# Patient Record
Sex: Female | Born: 1965 | Race: White | Hispanic: No | Marital: Married | Smoking: Current every day smoker
Health system: Southern US, Community
[De-identification: ages and names within clinical notes are randomized; demographics above are authoritative.]

## PROBLEM LIST (undated history)

## (undated) DIAGNOSIS — G459 Transient cerebral ischemic attack, unspecified: Secondary | ICD-10-CM

## (undated) DIAGNOSIS — E119 Type 2 diabetes mellitus without complications: Secondary | ICD-10-CM

## (undated) DIAGNOSIS — I639 Cerebral infarction, unspecified: Secondary | ICD-10-CM

## (undated) HISTORY — DX: Type 2 diabetes mellitus without complications: E11.9

## (undated) HISTORY — PX: CHOLECYSTECTOMY: SHX55

## (undated) HISTORY — DX: Transient cerebral ischemic attack, unspecified: G45.9

## (undated) HISTORY — PX: ABDOMINAL HYSTERECTOMY: SHX81

## (undated) HISTORY — PX: BLADDER SURGERY: SHX569

## (undated) HISTORY — DX: Cerebral infarction, unspecified: I63.9

---

## 2011-01-30 DIAGNOSIS — M25579 Pain in unspecified ankle and joints of unspecified foot: Secondary | ICD-10-CM

## 2011-02-02 ENCOUNTER — Inpatient Hospital Stay (INDEPENDENT_AMBULATORY_CARE_PROVIDER_SITE_OTHER)
Admission: RE | Admit: 2011-02-02 | Discharge: 2011-02-02 | Disposition: A | Discharge status: Home / Self Care | Payer: Self-pay | Source: Ambulatory Visit | Attending: Emergency Medicine | Admitting: Emergency Medicine

## 2011-02-02 ENCOUNTER — Ambulatory Visit
Admission: RE | Admit: 2011-02-02 | Discharge: 2011-02-02 | Disposition: A | Discharge status: Home / Self Care | Payer: Self-pay | Source: Ambulatory Visit | Attending: Emergency Medicine | Admitting: Emergency Medicine

## 2011-02-02 ENCOUNTER — Telehealth (INDEPENDENT_AMBULATORY_CARE_PROVIDER_SITE_OTHER): Payer: Self-pay | Admitting: *Deleted

## 2011-02-02 ENCOUNTER — Ambulatory Visit (HOSPITAL_COMMUNITY): Admission: RE | Admit: 2011-02-02 | Payer: Self-pay | Source: Ambulatory Visit

## 2011-02-02 ENCOUNTER — Other Ambulatory Visit: Payer: Self-pay | Admitting: Emergency Medicine

## 2011-02-02 ENCOUNTER — Encounter: Payer: Self-pay | Admitting: Emergency Medicine

## 2011-02-02 DIAGNOSIS — M25579 Pain in unspecified ankle and joints of unspecified foot: Secondary | ICD-10-CM

## 2011-02-08 ENCOUNTER — Telehealth (INDEPENDENT_AMBULATORY_CARE_PROVIDER_SITE_OTHER): Payer: Self-pay | Admitting: Emergency Medicine

## 2011-09-28 NOTE — Progress Notes (Signed)
Summary: INJURY TO LEFT FOOT   Vital Signs:  Patient Profile:   45 Years Old Female CC:      left foot/ankle pain x 2 days Height:     67 inches Weight:      259 pounds O2 Sat:      98 % O2 treatment:    Room Air Temp:     97.8 degrees F oral Pulse rate:   101 / minute Resp:     16 per minute BP sitting:   151 / 87  (left arm) Cuff size:   large  Pt. in pain?   yes    Location:   left foot  Vitals Entered By: Lajean Saver RN (February 02, 2011 12:14 PM)                   Updated Prior Medication List: No Medications Current Allergies: No known allergies History of Present Illness History from: patient Chief Complaint: left foot/ankle pain x 2 days History of Present Illness: Left foot injury for 2 days.  She was walking out of her apartment yesterday, felt that she twisted her ankle or stepped funny.  Had immediate pain and has had some swelling.  She hurt her R ankle many years ago and it never seemed to get completely better.  Used ice, Ace wrap, and Aleve, all helped a little bit.    REVIEW OF SYSTEMS Constitutional Symptoms      Denies fever, chills, night sweats, weight loss, weight gain, and fatigue.  Eyes       Denies change in vision, eye pain, eye discharge, glasses, contact lenses, and eye surgery. Ear/Nose/Throat/Mouth       Denies hearing loss/aids, change in hearing, ear pain, ear discharge, dizziness, frequent runny nose, frequent nose bleeds, sinus problems, sore throat, hoarseness, and tooth pain or bleeding.  Respiratory       Denies dry cough, productive cough, wheezing, shortness of breath, asthma, bronchitis, and emphysema/COPD.  Cardiovascular       Denies murmurs, chest pain, and tires easily with exhertion.    Gastrointestinal       Denies stomach pain, nausea/vomiting, diarrhea, constipation, blood in bowel movements, and indigestion. Genitourniary       Denies painful urination, kidney stones, and loss of urinary control. Neurological  Denies paralysis, seizures, and fainting/blackouts. Musculoskeletal       Complains of muscle pain, joint pain, and swelling.      Denies joint stiffness, decreased range of motion, redness, muscle weakness, and gout.      Comments: left foot/ankle Skin       Denies bruising, unusual mles/lumps or sores, and hair/skin or nail changes.  Psych       Denies mood changes, temper/anger issues, anxiety/stress, speech problems, depression, and sleep problems. Other Comments: Patient was stepping out of outside building and hurt left foot/anklle. Swelling and bruisin g noted . She ahs used ice and heat and wrapped her ankle. Previous left ankle injuries   Past History:  Past Medical History: Unremarkable  Past Surgical History: Cholecystectomy Hysterectomy  Family History: Mother- MI Aunts- Breast CA Grandmother- CVA  Social History: Current Smoker 1PPD Alcohol use-yes- Rare Drug use-no Smoking Status:  current Drug Use:  no Physical Exam General appearance: well developed, well nourished, no acute distress MSE: oriented to time, place, and person L foot/ ankle: FROM, full strength.  Most tenderness is ATFL, anterior ankle, along peroneal tendon.  No true TTP posterior aspects of medial/lateral malleolus,  navicular, base of 5th, calcaneus, Achilles, or proximal fibula.  +swelling but similar to opposite side. Distal NV status intact. Assessment New Problems: ANKLE PAIN (ICD-719.47)   Plan New Orders: T-DG Ankle Complete*L* [73610] No Charge Patient Arrived (NCPA0) [NCPA0] Planning Comments:   Xray is ordered.  Radiology performed the Xray but lost it and we cannot see it.  According to the [facility] ankle rules, patient most likely with sprained ankle and slight peroneal tendonopathy.    Xray has now returned and has been read.  Moderate osteoarthritic changes and spurring is present, but no acute fracture is seen.  Patient to be called and informed.  Should ice, elevate, rest,  with Ibuprofen/Aleve for a few days.  If not improving, would advise that she see Dr. Pearletha Forge for evaluation.   The patient and/or caregiver has been counseled thoroughly with regard to medications prescribed including dosage, schedule, interactions, rationale for use, and possible side effects and they verbalize understanding.  Diagnoses and expected course of recovery discussed and will return if not improved as expected or if the condition worsens. Patient and/or caregiver verbalized understanding.   Orders Added: 1)  T-DG Ankle Complete*L* [73610] 2)  No Charge Patient Arrived (NCPA0) [NCPA0]

## 2011-09-28 NOTE — Telephone Encounter (Signed)
  Phone Note Outgoing Call   Call placed by: Clemens Catholic LPN,  February 02, 2011 4:43 PM Call placed to: Patient Summary of Call: pt informed of her xray results. advised her to call back if not improving and we will sch appt with dr Pearletha Forge. Initial call taken by: Clemens Catholic LPN,  February 02, 2011 4:44 PM

## 2011-09-28 NOTE — Telephone Encounter (Signed)
Summary: z  Phone Note Outgoing Call Call back at [facility] Phone [phone]   Call placed by: Emilio Math,  February 08, 2011 2:43 PM Call placed to: Patient Summary of Call: Lft msg hope her ankle is better, make appt with Dr Pearletha Forge

## 2012-03-30 ENCOUNTER — Encounter: Payer: Self-pay | Admitting: Obstetrics and Gynecology

## 2012-04-12 ENCOUNTER — Encounter: Payer: Self-pay | Admitting: Obstetrics and Gynecology

## 2013-02-14 ENCOUNTER — Emergency Department
Admission: EM | Admit: 2013-02-14 | Discharge: 2013-02-14 | Disposition: A | Discharge status: Home / Self Care | Payer: Self-pay | Source: Home / Self Care | Attending: Family Medicine | Admitting: Family Medicine

## 2013-02-14 ENCOUNTER — Encounter: Payer: Self-pay | Admitting: *Deleted

## 2013-02-14 DIAGNOSIS — K0889 Other specified disorders of teeth and supporting structures: Secondary | ICD-10-CM

## 2013-02-14 DIAGNOSIS — J0100 Acute maxillary sinusitis, unspecified: Secondary | ICD-10-CM

## 2013-02-14 DIAGNOSIS — K089 Disorder of teeth and supporting structures, unspecified: Secondary | ICD-10-CM

## 2013-02-14 MED ORDER — BENZONATATE 200 MG PO CAPS: 200.0000 mg | ORAL_CAPSULE | Freq: Every day | ORAL | Status: DC

## 2013-02-14 MED ORDER — HYDROCODONE-ACETAMINOPHEN 5-325 MG PO TABS: ORAL_TABLET | ORAL | Status: DC

## 2013-02-14 MED ORDER — AMOXICILLIN 875 MG PO TABS: 875.0000 mg | ORAL_TABLET | Freq: Two times a day (BID) | ORAL | Status: DC

## 2013-02-14 NOTE — ED Provider Notes (Signed)
History     CSN: [id number]  Arrival date & time 02/14/13  4098   First MD Initiated Contact with Patient 02/14/13 1935      Chief Complaint  Patient presents with  . Dental Pain  . Facial Pain       HPI Comments: Patient states that she has a cracked right maxillary tooth for about 4 months; the tooth has become increasingly painful over the past week.  She has also had a sore throat for about 2 to 3 days, with increasing sinus congestion.  She has now developed right facial pain.  No fevers, chills, and sweats but she has felt hot.  The history is provided by the patient.    History reviewed. No pertinent past medical history.  Past Surgical History  Procedure Laterality Date  . Abdominal hysterectomy    . Bladder surgery      Family History  Problem Relation Age of Onset  . Stroke Mother     History  Substance Use Topics  . Smoking status: Current Every Day Smoker -- 1.00 packs/day  . Smokeless tobacco: Not on file  . Alcohol Use: Yes    OB History   Grav Para Term Preterm Abortions TAB SAB Ect Mult Living                  Review of Systems + sore throat + cough No pleuritic pain No wheezing + nasal congestion + post-nasal drainage + sinus pain/pressure + toothache No itchy/red eyes ? earache No hemoptysis No SOB No fever/chills No nausea No vomiting No abdominal pain No diarrhea No urinary symptoms No skin rashes + fatigue No myalgias + headache Used OTC meds without relief  Allergies  Review of patient's allergies indicates no known allergies.  Home Medications   Current Outpatient Rx  Name  Route  Sig  Dispense  Refill  . naproxen sodium (ANAPROX) 220 MG tablet   Oral   Take 220 mg by mouth 2 (two) times daily with a meal.         . amoxicillin (AMOXIL) 875 MG tablet   Oral   Take 1 tablet (875 mg total) by mouth 2 (two) times daily.   28 tablet   0   . benzonatate (TESSALON) 200 MG capsule   Oral   Take 1 capsule (200  mg total) by mouth at bedtime.   12 capsule   0   . HYDROcodone-acetaminophen (NORCO/VICODIN) 5-325 MG per tablet      Take one by mouth at bedtime as needed for pain   10 tablet   0     BP 138/86  Pulse 99  Temp(Src) 98.5 F (36.9 C)  Resp 18  Ht 5\' 7"  (1.702 m)  Wt 279 lb (126.554 kg)  BMI 43.69 kg/m2  SpO2 97%  Physical Exam  Nursing note and vitals reviewed. Constitutional: She is oriented to person, place, and time. She appears well-developed and well-nourished. No distress.  Patient is obese (BMI 43.7)  HENT:  Head: Normocephalic.  Right Ear: Tympanic membrane and ear canal normal.  Left Ear: Tympanic membrane and ear canal normal.  Nose: Nose normal.  Mouth/Throat: No oral lesions. Dental caries present. No oropharyngeal exudate.    There is tenderness over the right maxillary sinus area.  There is tenderness over maxillary and mandibular teeth noted in red.  Eyes: Conjunctivae and EOM are normal. Pupils are equal, round, and reactive to light. Right eye exhibits no discharge. Left eye exhibits  no discharge.  Neck: Neck supple.  Cardiovascular: Normal rate and normal heart sounds.   Pulmonary/Chest: Breath sounds normal.  Abdominal: Soft. There is no tenderness.  Lymphadenopathy:    She has no cervical adenopathy.  Neurological: She is alert and oriented to person, place, and time.  Skin: Skin is warm and dry.    ED Course  Procedures  None      1. Pain, dental   2. Acute maxillary sinusitis; suspect a developing viral URI      MDM  Begin amoxicillin for two weeks.  Prescription written for Benzonatate [facility]) to take at bedtime for night-time cough.  Lortab for pain at night. Take Mucinex D (guaifenesin with decongestant) twice daily for congestion.  Increase fluid intake, rest. May use Afrin nasal spray (or generic oxymetazoline) twice daily for about 5 days.  Also recommend using saline nasal spray several times daily and saline nasal  irrigation (AYR is a common brand) Stop all antihistamines for now, and other non-prescription cough/cold preparations. May take Ibuprofen 200mg , 4 tabs every 8 hours with food as needed. Followup with dentist as soon as possible. If symptoms become significantly worse during the night or over the weekend, proceed to the local emergency room.         Lattie Haw, MD 02/18/13 [phone]

## 2013-02-14 NOTE — ED Notes (Signed)
Pt reports having a cracked tooth on the RT side x 4 mths. She also c/o RT side facial pain radiating to her neck and nasal congestion x 1wk. Denies fever.

## 2013-02-18 ENCOUNTER — Telehealth: Payer: Self-pay | Admitting: Emergency Medicine

## 2013-10-10 ENCOUNTER — Other Ambulatory Visit: Payer: Self-pay | Admitting: Internal Medicine

## 2013-10-10 DIAGNOSIS — R130 Aphagia: Secondary | ICD-10-CM

## 2013-10-28 ENCOUNTER — Emergency Department (INDEPENDENT_AMBULATORY_CARE_PROVIDER_SITE_OTHER)
Admission: EM | Admit: 2013-10-28 | Discharge: 2013-10-28 | Disposition: A | Discharge status: Home / Self Care | Payer: Self-pay | Source: Home / Self Care | Attending: Family Medicine | Admitting: Family Medicine

## 2013-10-28 ENCOUNTER — Encounter: Payer: Self-pay | Admitting: Emergency Medicine

## 2013-10-28 DIAGNOSIS — K089 Disorder of teeth and supporting structures, unspecified: Secondary | ICD-10-CM

## 2013-10-28 DIAGNOSIS — K219 Gastro-esophageal reflux disease without esophagitis: Secondary | ICD-10-CM

## 2013-10-28 DIAGNOSIS — IMO0002 Reserved for concepts with insufficient information to code with codable children: Secondary | ICD-10-CM

## 2013-10-28 DIAGNOSIS — E1165 Type 2 diabetes mellitus with hyperglycemia: Secondary | ICD-10-CM

## 2013-10-28 DIAGNOSIS — K0889 Other specified disorders of teeth and supporting structures: Secondary | ICD-10-CM

## 2013-10-28 DIAGNOSIS — IMO0001 Reserved for inherently not codable concepts without codable children: Secondary | ICD-10-CM

## 2013-10-28 DIAGNOSIS — H539 Unspecified visual disturbance: Secondary | ICD-10-CM

## 2013-10-28 LAB — POCT FASTING CBG KUC MANUAL ENTRY: POCT Glucose (KUC): 247 mg/dL — AB (ref 70–99)

## 2013-10-28 MED ORDER — TRAMADOL HCL 50 MG PO TABS: ORAL_TABLET | ORAL | Status: DC

## 2013-10-28 MED ORDER — AMOXICILLIN 875 MG PO TABS: 875.0000 mg | ORAL_TABLET | Freq: Two times a day (BID) | ORAL | Status: DC

## 2013-10-28 MED ORDER — RANITIDINE HCL 150 MG PO TABS: 150.0000 mg | ORAL_TABLET | Freq: Two times a day (BID) | ORAL | Status: DC

## 2013-10-28 NOTE — ED Provider Notes (Signed)
CSN: [id number]     Arrival date & time 10/28/13  1522 History   First MD Initiated Contact with Patient 10/28/13 1558     Chief Complaint  Patient presents with  . Loss of Vision    for 2 weeks  . Facial Pain    for 1 week      HPI Comments: Patient presents with several complaints: 1)  She has had bilateral decreased vision (near and far) for about one month. 2)  She has had increased thirst.  She checked her glucose at work recently where it was 289.  She has had bilateral tingling in her feet for several weeks. 3)  She complains of recurring stomach discomfort, relieved with PeptoBismol. 4)  She complains of recurrent dental pain in two upper and lower teeth that were evaluated and treated here 02/18/13.  She never followed up with a dentist.  No fevers, chills, and sweats   The history is provided by the patient.    History reviewed. No pertinent past medical history. Past Surgical History  Procedure Laterality Date  . Abdominal hysterectomy    . Bladder surgery     Family History  Problem Relation Age of Onset  . Heart attack Mother   . Cancer Other   . Stroke Other    History  Substance Use Topics  . Smoking status: Current Every Day Smoker -- 1.00 packs/day  . Smokeless tobacco: Not on file  . Alcohol Use: Yes   OB History   Grav Para Term Preterm Abortions TAB SAB Ect Mult Living                 Review of Systems  Constitutional: Negative for chills and fatigue.  HENT: Positive for congestion, dental problem, facial swelling and sinus pressure. Negative for ear discharge, ear pain, mouth sores, nosebleeds, sore throat and trouble swallowing.        Decreased vision  Eyes: Negative.   Cardiovascular: Negative.   Gastrointestinal: Positive for abdominal pain and diarrhea. Negative for nausea and vomiting.  Endocrine: Positive for polydipsia. Negative for polyuria.  Genitourinary: Negative for dysuria, urgency, frequency and hematuria.  Musculoskeletal: Positive  for arthralgias.  Skin: Negative.   Neurological: Negative.     Allergies  Review of patient's allergies indicates no known allergies.  Home Medications   Current Outpatient Rx  Name  Route  Sig  Dispense  Refill  . amoxicillin (AMOXIL) 875 MG tablet   Oral   Take 1 tablet (875 mg total) by mouth 2 (two) times daily.   28 tablet   0   . naproxen sodium (ANAPROX) 220 MG tablet   Oral   Take 220 mg by mouth 2 (two) times daily with a meal.         . ranitidine (ZANTAC) 150 MG tablet   Oral   Take 1 tablet (150 mg total) by mouth 2 (two) times daily.   60 tablet   1   . traMADol (ULTRAM) 50 MG tablet      One PO Q6hr prn pain   16 tablet   0    BP 128/86  Pulse 101  Temp(Src) 98.6 F (37 C) (Oral)  Ht 5\' 7"  (1.702 m)  Wt 261 lb (118.389 kg)  BMI 40.87 kg/m2  SpO2 96% Physical Exam  Nursing note and vitals reviewed. Constitutional: She is oriented to person, place, and time. She appears well-developed and well-nourished. No distress.  Patient is obese (BMI 40.9)  HENT:  Head: Normocephalic.  Right Ear: External ear normal.  Left Ear: External ear normal.  Nose: Nose normal.  Mouth/Throat: Oropharynx is clear and moist. No oral lesions. No trismus in the jaw. Abnormal dentition. Dental caries present. No dental abscesses or uvula swelling.    Noted teeth are eroded and tender to tap.  Surrounding gingiva tender to palpation but not swollen.  Right facial tenderness over mandible but not swollen or erythematous.  Eyes: Conjunctivae, EOM and lids are normal. Pupils are equal, round, and reactive to light. Right eye exhibits no discharge. Left eye exhibits no discharge.  Fundoscopic exam:      The right eye shows no hemorrhage.       The left eye shows no hemorrhage.  Neck: Neck supple.  Cardiovascular: Normal heart sounds.   Pulmonary/Chest: Breath sounds normal.  Abdominal: Soft. There is no tenderness.  Musculoskeletal: She exhibits no edema.    Lymphadenopathy:    She has no cervical adenopathy.  Neurological: She is alert and oriented to person, place, and time.  Skin: Skin is warm and dry. No rash noted.    ED Course  Procedures  none  Labs Reviewed -  FS Glucose 247       MDM   1. Pain, dental   2. Vision changes   3. GERD (gastroesophageal reflux disease)   4. Diabetes type 2, uncontrolled   5. Severe obesity (BMI >= 40)    Begin amoxicillin (free at [employer]).  Tramadol for pain.  Followup with dentist As soon as possible.  Begin Zantac  Followup with PCP for DM management.  Followup with ophthalmologist As soon as possible.     Kandra Nicolas, MD 10/29/13 1025

## 2013-10-28 NOTE — ED Notes (Signed)
Denise Tyler complains of changes in her vision for the last 2 weeks. She checked her sugar at work and it was over 200 mg/dl.  She also reports facial pain for the last week. She did have chills and sweats for two days.

## 2013-10-28 NOTE — Discharge Instructions (Signed)
Recommend follow-up with ophthalmologist as soon as possible   Blurred Vision You have been seen today complaining of blurred vision. This means you have a loss of ability to see small details.  CAUSES  Blurred vision can be a symptom of underlying eye problems, such as:  Aging of the eye (presbyopia).  Glaucoma.  Cataracts.  Eye infection.  Eye-related migraine.  Diabetes mellitus.  Fatigue.  Migraine headaches.  High blood pressure.  Breakdown of the back of the eye (macular degeneration).  Problems caused by some medications. The most common cause of blurred vision is the need for eyeglasses or a new prescription. Today in the emergency department, no cause for your blurred vision can be found. SYMPTOMS  Blurred vision is the loss of visual sharpness and detail (acuity). DIAGNOSIS  Should blurred vision continue, you should see your caregiver. If your caregiver is your primary care physician, he or she may choose to refer you to another specialist.  TREATMENT  Do not ignore your blurred vision. Make sure to have it checked out to see if further treatment or referral is necessary. SEEK MEDICAL CARE IF:  You are unable to get into a specialist so we can help you with a referral. SEEK IMMEDIATE MEDICAL CARE IF: You have severe eye pain, severe headache, or sudden loss of vision. MAKE SURE YOU:   Understand these instructions.  Will watch your condition.  Will get help right away if you are not doing well or get worse. Document Released: 10/15/2003 Document Revised: 01/04/2012 Document Reviewed: 05/16/2008 [facility]: [facility] Rhonda W Patient Information 2014 [place].

## 2013-11-08 ENCOUNTER — Encounter: Payer: Self-pay | Admitting: Physician Assistant

## 2013-11-08 ENCOUNTER — Ambulatory Visit (INDEPENDENT_AMBULATORY_CARE_PROVIDER_SITE_OTHER): Payer: Self-pay | Admitting: Physician Assistant

## 2013-11-08 VITALS — BP 141/74 | HR 93 | Ht 67.0 in | Wt 264.0 lb

## 2013-11-08 DIAGNOSIS — Z79899 Other long term (current) drug therapy: Secondary | ICD-10-CM

## 2013-11-08 DIAGNOSIS — K219 Gastro-esophageal reflux disease without esophagitis: Secondary | ICD-10-CM

## 2013-11-08 DIAGNOSIS — IMO0002 Reserved for concepts with insufficient information to code with codable children: Secondary | ICD-10-CM | POA: Insufficient documentation

## 2013-11-08 DIAGNOSIS — E1165 Type 2 diabetes mellitus with hyperglycemia: Secondary | ICD-10-CM | POA: Insufficient documentation

## 2013-11-08 DIAGNOSIS — R1013 Epigastric pain: Secondary | ICD-10-CM | POA: Insufficient documentation

## 2013-11-08 DIAGNOSIS — E119 Type 2 diabetes mellitus without complications: Secondary | ICD-10-CM

## 2013-11-08 DIAGNOSIS — R7309 Other abnormal glucose: Secondary | ICD-10-CM

## 2013-11-08 LAB — POCT GLYCOSYLATED HEMOGLOBIN (HGB A1C): HEMOGLOBIN A1C: 8.9

## 2013-11-08 MED ORDER — LISINOPRIL 10 MG PO TABS: 10.0000 mg | ORAL_TABLET | Freq: Every day | ORAL | Status: DC

## 2013-11-08 MED ORDER — GLIPIZIDE 5 MG PO TABS: 5.0000 mg | ORAL_TABLET | Freq: Two times a day (BID) | ORAL | Status: DC

## 2013-11-08 MED ORDER — METFORMIN HCL 1000 MG PO TABS: 1000.0000 mg | ORAL_TABLET | Freq: Two times a day (BID) | ORAL | Status: DC

## 2013-11-08 MED ORDER — DICYCLOMINE HCL 10 MG PO CAPS: 10.0000 mg | ORAL_CAPSULE | Freq: Three times a day (TID) | ORAL | Status: DC

## 2013-11-08 NOTE — Progress Notes (Signed)
Subjective:    Patient ID: Denise Tyler, female    DOB: [birth year], 48 y.o.   MRN: [id number]  HPI Patient is a 48 year old female who presents to the clinic to establish care. She does not have any insurance. She's been being seen by urgent care; however, her conditions have become more chronic. Patient has a past medical history mini stroke at 48 years old however until couple weeks ago not on any medications. Patient is a current smoker of one pack plus a day for the last 25 years. She rarely drinks alcohol. Patient does have a past medical history for breast cancer with her aunt, heart attack from her mother who died at age 42, high blood pressure, stroke.  Patient has a hysterectomy(one ovary present) and gallbladder removal.  Today patient comes in with elevated random glucose of over 200. Patient is having symptoms of diabetes with increased thirst, vision changes and occasional numbness and tingling of extremities. She has fatigue all the time. She is currently not on any medication. She denies any exercise or watching her diet.  She is also concerned because she has a lot of abdominal pain. Her stomach feels upset all the time. Her stomach is made better with Pepto-Bismol but then always comes back. She feels bloated all the time. She is predominantly diarrhea. If she eats anything it almost goes right through like she has to go to the bathroom. She does not notice it worse with any particular food. She denies any bad taste in mouth or feeling like acid is coming back up. She does feel nausea occasionally. Patient has not started medication given by Dr. Assunta Found in urgent care. Patient reports that she was told once she had hepatitis C. by the [employer]. She has never had anything done with this. No medical doctor has told her about this.   Review of Systems  Unable to perform ROS All other systems reviewed and are negative.       Objective:   Physical Exam  Constitutional: She is  oriented to person, place, and time. She appears well-developed and well-nourished.  Obese.   HENT:  Head: Normocephalic and atraumatic.  Neck: Normal range of motion. Neck supple. No thyromegaly present.  Cardiovascular: Normal rate, regular rhythm and normal heart sounds.   Pulmonary/Chest: Effort normal and breath sounds normal. She has no wheezes.  Abdominal: Soft. Bowel sounds are normal.  Mild to moderate tenderness over the epigastric and left upper quadrant area. No guarding. No masses palpated.  Patient. Obese not able to palpate to see if there's any hepatomegaly.  Lymphadenopathy:    She has no cervical adenopathy.  Neurological: She is alert and oriented to person, place, and time.  Skin: Skin is dry.  Psychiatric: She has a normal mood and affect. Her behavior is normal.          Assessment & Plan:  Diabetes, type II with complications/elevated blood pressure-A1c today was 8.9. I would like to start her on a combination therapy. Patient does not have insurance therefore we have to think about the cheapest medications possible. Started her on metformin 1000 mg twice a day as well as glipizide 5 mg twice a day. Patient aware not to start any medications until liver enzymes are checked. Side effects of metformin with stomach were discussed. I am concerned this may make her GI symptoms worse but we at least have to try. Patient aware that with glipizide she must eat or her sugars could go to  low. If she starts to feel dizzy after a meal like her sugars are falling she just eat. Patient's blood pressure is a little elevated today as well as she needs to be put on an ACE inhibitor. Lisinopril 10 mg was started today. Followup in 3 months.  Epigastric abdominal pain/GERD-Start zantac twice a day that was given by Dr. in urgent care. Also gave her Bentyl to see if helps with some of the stomach cramps. Unclear etiology at this point. Could be GERD, IBS. Gave handout for IBS and foods to  look at avoiding. Some of these issues could even be related to diabetes. Per patient she has had CT scan within the last 3 years that was negative for any masses. Will check lipase today. Patient did mention something about hepatitis C. we'll look at liver enzymes and see if we need to do a further evaluation into this.  Patient aware she needs some health maintenance items completed. At this point she is not ready to indulge financially. We'll continue to follow patient and do things as needed.

## 2013-11-08 NOTE — Patient Instructions (Signed)
Irritable Bowel Syndrome °Irritable Bowel Syndrome (IBS) is caused by a disturbance of normal bowel function. Other terms used are spastic colon, mucous colitis, and irritable colon. It does not require surgery, nor does it lead to cancer. There is no cure for IBS. But with proper diet, stress reduction, and medication, you will find that your problems (symptoms) will gradually disappear or improve. IBS is a common digestive disorder. It usually appears in late adolescence or early adulthood. Women develop it twice as often as men. °CAUSES  °After food has been digested and absorbed in the small intestine, waste material is moved into the colon (large intestine). In the colon, water and salts are absorbed from the undigested products coming from the small intestine. The remaining residue, or fecal material, is held for elimination. Under normal circumstances, gentle, rhythmic contractions on the bowel walls push the fecal material along the colon towards the rectum. In IBS, however, these contractions are irregular and poorly coordinated. The fecal material is either retained too long, resulting in constipation, or expelled too soon, producing diarrhea. °SYMPTOMS  °The most common symptom of IBS is pain. It is typically in the lower left side of the belly (abdomen). But it may occur anywhere in the abdomen. It can be felt as heartburn, backache, or even as a dull pain in the arms or shoulders. The pain comes from excessive bowel-muscle spasms and from the buildup of gas and fecal material in the colon. This pain: °· Can range from sharp belly (abdominal) cramps to a dull, continuous ache. °· Usually worsens soon after eating. °· Is typically relieved by having a bowel movement or passing gas. °Abdominal pain is usually accompanied by constipation. But it may also produce diarrhea. The diarrhea typically occurs right after a meal or upon arising in the morning. The stools are typically soft and watery. They are often  flecked with secretions (mucus). °Other symptoms of IBS include: °· Bloating. °· Loss of appetite. °· Heartburn. °· Feeling sick to your stomach (nausea). °· Belching °· Vomiting °· Gas. °IBS may also cause a number of symptoms that are unrelated to the digestive system: °· Fatigue. °· Headaches. °· Anxiety °· Shortness of breath °· Difficulty in concentrating. °· Dizziness. °These symptoms tend to come and go. °DIAGNOSIS  °The symptoms of IBS closely mimic the symptoms of other, more serious digestive disorders. So your caregiver may wish to perform a variety of additional tests to exclude these disorders. He/she wants to be certain of learning what is wrong (diagnosis). The nature and purpose of each test will be explained to you. °TREATMENT °A number of medications are available to help correct bowel function and/or relieve bowel spasms and abdominal pain. Among the drugs available are: °· Mild, non-irritating laxatives for severe constipation and to help restore normal bowel habits. °· Specific anti-diarrheal medications to treat severe or prolonged diarrhea. °· Anti-spasmodic agents to relieve intestinal cramps. °· Your caregiver may also decide to treat you with a mild tranquilizer or sedative during unusually stressful periods in your life. °The important thing to remember is that if any drug is prescribed for you, make sure that you take it exactly as directed. Make sure that your caregiver knows how well it worked for you. °HOME CARE INSTRUCTIONS  °· Avoid foods that are high in fat or oils. Some examples are:heavy cream, butter, frankfurters, sausage, and other fatty meats. °· Avoid foods that have a laxative effect, such as fruit, fruit juice, and dairy products. °· Cut out   carbonated drinks, chewing gum, and "gassy" foods, such as beans and cabbage. This may help relieve bloating and belching. °· Bran taken with plenty of liquids may help relieve constipation. °· Keep track of what foods seem to trigger  your symptoms. °· Avoid emotionally charged situations or circumstances that produce anxiety. °· Start or continue exercising. °· Get plenty of rest and sleep. °MAKE SURE YOU:  °· Understand these instructions. °· Will watch your condition. °· Will get help right away if you are not doing well or get worse. °Document Released: 10/12/2005 Document Revised: 01/04/2012 Document Reviewed: 06/01/2008 °[employer]® Patient Information ©2014 [employer]. ° °

## 2013-11-09 LAB — COMPLETE METABOLIC PANEL WITH GFR
ALT: 25 U/L (ref 0–35)
AST: 14 U/L (ref 0–37)
Albumin: 4.1 g/dL (ref 3.5–5.2)
Alkaline Phosphatase: 98 U/L (ref 39–117)
BUN: 8 mg/dL (ref 6–23)
CO2: 28 mEq/L (ref 19–32)
CREATININE: 0.71 mg/dL (ref 0.50–1.10)
Calcium: 8.9 mg/dL (ref 8.4–10.5)
Chloride: 104 mEq/L (ref 96–112)
GFR, Est African American: >89 mL/min
GFR, Est Non African American: >89 mL/min
Glucose, Bld: 160 mg/dL — ABNORMAL HIGH (ref 70–99)
Potassium: 4.1 mEq/L (ref 3.5–5.3)
Sodium: 137 mEq/L (ref 135–145)
Total Bilirubin: 0.5 mg/dL (ref 0.3–1.2)
Total Protein: 6.7 g/dL (ref 6.0–8.3)

## 2013-11-09 LAB — LIPASE: Lipase: 17 U/L (ref 0–75)

## 2014-11-16 ENCOUNTER — Ambulatory Visit: Payer: Self-pay | Admitting: Physician Assistant

## 2014-11-21 ENCOUNTER — Encounter: Payer: Self-pay | Admitting: Physician Assistant

## 2014-11-21 ENCOUNTER — Ambulatory Visit (INDEPENDENT_AMBULATORY_CARE_PROVIDER_SITE_OTHER): Payer: Self-pay | Admitting: Physician Assistant

## 2014-11-21 VITALS — BP 127/84 | HR 97 | Ht 67.0 in | Wt 263.0 lb

## 2014-11-21 DIAGNOSIS — R300 Dysuria: Secondary | ICD-10-CM

## 2014-11-21 DIAGNOSIS — R197 Diarrhea, unspecified: Secondary | ICD-10-CM

## 2014-11-21 DIAGNOSIS — R1011 Right upper quadrant pain: Secondary | ICD-10-CM

## 2014-11-21 DIAGNOSIS — N760 Acute vaginitis: Secondary | ICD-10-CM

## 2014-11-21 DIAGNOSIS — R1013 Epigastric pain: Secondary | ICD-10-CM

## 2014-11-21 DIAGNOSIS — K589 Irritable bowel syndrome without diarrhea: Secondary | ICD-10-CM

## 2014-11-21 DIAGNOSIS — E1165 Type 2 diabetes mellitus with hyperglycemia: Secondary | ICD-10-CM

## 2014-11-21 DIAGNOSIS — IMO0002 Reserved for concepts with insufficient information to code with codable children: Secondary | ICD-10-CM

## 2014-11-21 LAB — CBC WITH DIFFERENTIAL/PLATELET
Basophils Absolute: 0 10*3/uL (ref 0.0–0.1)
Basophils Relative: 0 % (ref 0–1)
Eosinophils Absolute: 0.2 10*3/uL (ref 0.0–0.7)
Eosinophils Relative: 2 % (ref 0–5)
HCT: 43.5 % (ref 36.0–46.0)
Hemoglobin: 14.4 g/dL (ref 12.0–15.0)
Lymphocytes Relative: 23 % (ref 12–46)
Lymphs Abs: 2.2 10*3/uL (ref 0.7–4.0)
MCH: 26.8 pg (ref 26.0–34.0)
MCHC: 33.1 g/dL (ref 30.0–36.0)
MCV: 81 fL (ref 78.0–100.0)
MPV: 10.8 fL (ref 8.6–12.4)
Monocytes Absolute: 0.4 10*3/uL (ref 0.1–1.0)
Monocytes Relative: 4 % (ref 3–12)
NEUTROS PCT: 71 % (ref 43–77)
Neutro Abs: 6.9 10*3/uL (ref 1.7–7.7)
Platelets: 275 10*3/uL (ref 150–400)
RBC: 5.37 MIL/uL — ABNORMAL HIGH (ref 3.87–5.11)
RDW: 14.3 % (ref 11.5–15.5)
WBC: 9.7 10*3/uL (ref 4.0–10.5)

## 2014-11-21 LAB — POCT URINALYSIS DIPSTICK
BILIRUBIN UA: NEGATIVE
KETONES UA: NEGATIVE
Leukocytes, UA: NEGATIVE
NITRITE UA: NEGATIVE
PH UA: 5.5
Protein, UA: NEGATIVE
Spec Grav, UA: 1.02
UROBILINOGEN UA: 0.2

## 2014-11-21 LAB — POCT GLYCOSYLATED HEMOGLOBIN (HGB A1C): HEMOGLOBIN A1C: 11.4

## 2014-11-21 MED ORDER — GLIPIZIDE 10 MG PO TABS: 10.0000 mg | ORAL_TABLET | Freq: Two times a day (BID) | ORAL | Status: DC

## 2014-11-21 MED ORDER — INSULIN GLARGINE 300 UNIT/ML ~~LOC~~ SOPN: 10.0000 [IU] | PEN_INJECTOR | Freq: Every day | SUBCUTANEOUS | Status: DC

## 2014-11-21 MED ORDER — ELUXADOLINE 100 MG PO TABS: 75.0000 mg | ORAL_TABLET | Freq: Two times a day (BID) | ORAL | Status: DC

## 2014-11-21 MED ORDER — CANAGLIFLOZIN 300 MG PO TABS: 300.0000 mg | ORAL_TABLET | Freq: Every day | ORAL | Status: DC

## 2014-11-21 MED ORDER — LISINOPRIL 10 MG PO TABS: 10.0000 mg | ORAL_TABLET | Freq: Every day | ORAL | Status: DC

## 2014-11-21 NOTE — Patient Instructions (Addendum)
Stop metformin.  Start toujeo, glipizide, invokana,  Viberzi twice.    Check on CT price.

## 2014-11-21 NOTE — Progress Notes (Signed)
   Subjective:    Patient ID: Denise Tyler, female    DOB: [birth year], 49 y.o.   MRN: [id number]  HPI Pt is a 49 yo female who has not been seen in over a year with DM, type II and ongoing diarrhea and RUQ pain.   She is suppose to be on metformin and glipizide but admits she ran out and not been taking glipizide but taking metformin. She is not watching her diet or exercising. She is not checking her sugars. She does have glucometer at home. She is having diarrhea daily and multiple times a day. She has ongoing RUQ and epigastric pain for last year. Denies any acid reflux or LUQ pain, burning, or gnawning sensation. She has had her gallbladder removed. Pain is worse after she eats. In the past PPI's and zantac have not helped at all. Bentyl tried and did not help.   Pt also having some vaginal itching and slight discomfort while urinating. No fever, chills, flank pain, nausea.    Review of Systems  All other systems reviewed and are negative.      Objective:   Physical Exam  Constitutional: She is oriented to person, place, and time. She appears well-developed and well-nourished.  Obese.  HENT:  Head: Normocephalic and atraumatic.  Cardiovascular: Normal rate, regular rhythm and normal heart sounds.   Pulmonary/Chest: Effort normal and breath sounds normal.  Abdominal:  Abdomen slightly distended and hard.  Diffuse tenderness of RUQ and epigastric area.  No guarding or rebound.   Neurological: She is alert and oriented to person, place, and time.  Psychiatric: She has a normal mood and affect. Her behavior is normal.          Assessment & Plan:  Diabetes mellitus type 2, uncontrolled- .[name] Kitchen Lab Results  Component Value Date   HGBA1C 11.4 11/21/2014  glucose in urine and not on a GLT-2.  Discussed with patient this is out of control and could be causing a lot of her symptoms.  Start toujeo. First pen given. Discussed how to use. Start 10 units increase by 2 units every 5 days  until fasting 100-120.  Call if need supplies for glucometer.  Start invokana. Discussed side effects.  Continue glipizide twice a day.  Keep logs of sugar readings.  Follow up in 3 months    RUQ pain/epigastric pain- unclear etiology. Will order CT of abdomen. No insurance check on price. Will get labs to look at liver function, lipase, cbc etc.   Diarrhea- uncertain etiology. Symptoms could be side effect from metformin. Stop metformin. Symptoms sound like IBS. Bentyl did not help in past. Gave coupon card to try viberzi. Not sure if will work on cash paying patients.   Dysuria vaginitis- UA- negative nitrates, leuks, trace blood. Will culture. Send off for wet prep. 500 glucose in urine. Likely some yeast causing itching. monistat OTC.

## 2014-11-22 ENCOUNTER — Other Ambulatory Visit: Payer: Self-pay | Admitting: Physician Assistant

## 2014-11-22 DIAGNOSIS — R1013 Epigastric pain: Secondary | ICD-10-CM

## 2014-11-22 DIAGNOSIS — R1011 Right upper quadrant pain: Secondary | ICD-10-CM

## 2014-11-22 LAB — COMPLETE METABOLIC PANEL WITH GFR
ALBUMIN: 4.1 g/dL (ref 3.5–5.2)
ALK PHOS: 113 U/L (ref 39–117)
ALT: 27 U/L (ref 0–35)
AST: 19 U/L (ref 0–37)
BUN: 10 mg/dL (ref 6–23)
CHLORIDE: 101 meq/L (ref 96–112)
CO2: 26 mEq/L (ref 19–32)
CREATININE: 0.66 mg/dL (ref 0.50–1.10)
Calcium: 9.2 mg/dL (ref 8.4–10.5)
GFR, Est African American: >89 mL/min
GFR, Est Non African American: >89 mL/min
Glucose, Bld: 258 mg/dL — ABNORMAL HIGH (ref 70–99)
Potassium: 4.3 mEq/L (ref 3.5–5.3)
SODIUM: 135 meq/L (ref 135–145)
Total Bilirubin: 0.6 mg/dL (ref 0.2–1.2)
Total Protein: 6.8 g/dL (ref 6.0–8.3)

## 2014-11-22 LAB — LIPASE: LIPASE: 26 U/L (ref 0–75)

## 2014-11-26 ENCOUNTER — Other Ambulatory Visit: Payer: Self-pay | Admitting: *Deleted

## 2014-11-26 ENCOUNTER — Ambulatory Visit (INDEPENDENT_AMBULATORY_CARE_PROVIDER_SITE_OTHER): Payer: Self-pay

## 2014-11-26 DIAGNOSIS — K760 Fatty (change of) liver, not elsewhere classified: Secondary | ICD-10-CM

## 2014-11-26 DIAGNOSIS — D3502 Benign neoplasm of left adrenal gland: Secondary | ICD-10-CM

## 2014-11-26 MED ORDER — AMBULATORY NON FORMULARY MEDICATION: Status: DC

## 2014-11-26 MED ORDER — IOHEXOL 300 MG/ML  SOLN
100.0000 mL | Freq: Once | INTRAMUSCULAR | Status: AC | PRN
  Administered 2014-11-26: 100 mL via INTRAVENOUS

## 2014-11-27 ENCOUNTER — Encounter: Payer: Self-pay | Admitting: Physician Assistant

## 2014-11-27 DIAGNOSIS — D3502 Benign neoplasm of left adrenal gland: Secondary | ICD-10-CM | POA: Insufficient documentation

## 2014-11-27 DIAGNOSIS — K760 Fatty (change of) liver, not elsewhere classified: Secondary | ICD-10-CM | POA: Insufficient documentation

## 2014-11-27 DIAGNOSIS — K5900 Constipation, unspecified: Secondary | ICD-10-CM | POA: Insufficient documentation

## 2014-12-03 ENCOUNTER — Telehealth: Payer: Self-pay | Admitting: *Deleted

## 2014-12-03 NOTE — Telephone Encounter (Signed)
Pt called today stating that since last fri she has had extremely blurred vision.  She is wondering if it could be coming from all of her medications, such as her insulin.  I told her that I didn't think that would be the case but I wanted to talk to you first.  She is having to use 2 sets of reading glasses for up close and reading glasses to drive. She currently does not have an eye dr.

## 2014-12-03 NOTE — Telephone Encounter (Signed)
Pt notified of recommendations

## 2014-12-03 NOTE — Telephone Encounter (Signed)
I don't think vision blurriness is coming from medications unless your sugar is going too low. I cannot imagine that is happening with how high it was at last exam. However your body could be adjusting to sugar levels decreasing. Please check sugars regularly to make sure not going too low(below 70). It could take some time for eye to calibrate. Also you do still need eye doctor to get baseling and make sure diabetic retinopathy has not set in.

## 2015-02-20 ENCOUNTER — Ambulatory Visit: Payer: Self-pay | Admitting: Physician Assistant

## 2015-11-21 ENCOUNTER — Emergency Department
Admission: EM | Admit: 2015-11-21 | Discharge: 2015-11-21 | Disposition: A | Discharge status: Home / Self Care | Payer: Self-pay | Source: Home / Self Care | Attending: Family Medicine | Admitting: Family Medicine

## 2015-11-21 ENCOUNTER — Encounter: Payer: Self-pay | Admitting: *Deleted

## 2015-11-21 DIAGNOSIS — J029 Acute pharyngitis, unspecified: Secondary | ICD-10-CM

## 2015-11-21 LAB — POCT RAPID STREP A (OFFICE): Rapid Strep A Screen: NEGATIVE

## 2015-11-21 MED ORDER — AMOXICILLIN 875 MG PO TABS: 875.0000 mg | ORAL_TABLET | Freq: Two times a day (BID) | ORAL | Status: DC

## 2015-11-21 NOTE — Discharge Instructions (Signed)
Take plain guaifenesin (1200mg  extended release tabs such as Mucinex) twice daily, with plenty of water, for cough and congestion.  May add Pseudoephedrine (30mg , one or two every 4 to 6 hours) for sinus congestion.  Get adequate rest.   May use Afrin nasal spray (or generic oxymetazoline) twice daily for about 5 days and then discontinue.  Also recommend using saline nasal spray several times daily and saline nasal irrigation (AYR is a common brand).   Try warm salt water gargles for sore throat.  Stop all antihistamines for now, and other non-prescription cough/cold preparations. May take Ibuprofen 200mg , 4 tabs every 8 hours with food for sore throat, body aches, etc. May take Delsym Cough Suppressant at bedtime for nighttime cough.  Follow-up with family doctor if not improving about7 to 10 days.

## 2015-11-21 NOTE — ED Notes (Signed)
Pt c/o "cold symptoms" for 3 days, sore throat and body aches developed yesterday. H/o frequent strep.

## 2015-11-21 NOTE — ED Provider Notes (Signed)
CSN: [id number]     Arrival date & time 11/21/15  1331 History   First MD Initiated Contact with Patient 11/21/15 1429     Chief Complaint  Patient presents with  . Sore Throat      HPI Comments: Patient complains of three day history of typical cold-like symptoms including mild sore throat, sinus congestion, chills/sweats, fatigue, and myalgias.  No cough   The history is provided by the patient.    Past Medical History  Diagnosis Date  . Mini stroke ([place])   . Diabetes mellitus without complication [facility])    Past Surgical History  Procedure Laterality Date  . Abdominal hysterectomy    . Bladder surgery    . Cholecystectomy     Family History  Problem Relation Age of Onset  . Heart attack Mother   . Hypertension Mother   . Cancer Other   . Stroke Other   . Cancer Maternal Aunt   . Stroke Maternal Uncle   . Stroke Maternal Grandfather    Social History  Substance Use Topics  . Smoking status: Current Every Day Smoker -- 1.00 packs/day  . Smokeless tobacco: None  . Alcohol Use: Yes   OB History    No data available     Review of Systems + sore throat No cough + sneezing No pleuritic pain No wheezing + nasal congestion + post-nasal drainage No sinus pain/pressure No itchy/red eyes ? earache No hemoptysis No SOB No fever, + chills/sweats No nausea No vomiting No abdominal pain + diarrhea No urinary symptoms No skin rash + fatigue + myalgias + headache Used OTC meds without relief  Allergies  Review of patient's allergies indicates no known allergies.  Home Medications   Prior to Admission medications   Medication Sig Start Date End Date Taking? Authorizing Provider  AMBULATORY NON FORMULARY MEDICATION Accu-Chek Compact Plus test strips and lancets Dx code: E11.65 11/26/14   Donella Stade, PA-C  amoxicillin (AMOXIL) 875 MG tablet Take 1 tablet (875 mg total) by mouth 2 (two) times daily. 11/21/15   Kandra Nicolas, MD  canagliflozin (INVOKANA) 300  MG TABS tablet Take 300 mg by mouth daily before breakfast. 11/21/14   Jade L Breeback, PA-C  Eluxadoline (VIBERZI) 100 MG TABS Take 75 mg by mouth 2 (two) times daily. 11/21/14   Jade L Breeback, PA-C  glipiZIDE (GLUCOTROL) 10 MG tablet Take 1 tablet (10 mg total) by mouth 2 (two) times daily before a meal. 11/21/14   Jade L Breeback, PA-C  Insulin Glargine (TOUJEO SOLOSTAR) 300 UNIT/ML SOPN Inject 10 Units into the skin at bedtime. Increase by 2 units every 5 days until fasting glucose is 100-120. 11/21/14   Jade L Breeback, PA-C  lisinopril (PRINIVIL,ZESTRIL) 10 MG tablet Take 1 tablet (10 mg total) by mouth daily. 11/21/14   Donella Stade, PA-C   Meds Ordered and Administered this Visit  Medications - No data to display  BP 131/79 mmHg  Pulse 120  Temp(Src) 98.5 F (36.9 C) (Oral)  Resp 18  Wt 244 lb (110.678 kg)  SpO2 96% No data found.   Physical Exam Nursing notes and Vital Signs reviewed. Appearance:  Patient appears stated age, and in no acute distress Eyes:  Pupils are equal, round, and reactive to light and accomodation.  Extraocular movement is intact.  Conjunctivae are not inflamed  Ears:  Canals normal.  Right tympanic membrane normal.  Left tympanic membrane slightly erythematous  Nose:  Congested turbinates.  No sinus  tenderness.    Pharynx:  Uvula erythematous/edematous Neck:  Supple.  Tender enlarged left tonsillar and posterior nodes   Lungs:  Clear to auscultation.  Breath sounds are equal.  Moving air well. Heart:  Regular rate and rhythm without murmurs, rubs, or gallops.  Abdomen:  Nontender without masses or hepatosplenomegaly.  Bowel sounds are present.  No CVA or flank tenderness.  Extremities:  No edema.  Skin:  No rash present.   ED Course  Procedures  None    Labs Reviewed  STREP A DNA PROBE  POCT RAPID STREP A (OFFICE) negative   Tympanogram:  Left ear wide; Right ear normal    MDM   1. Acute pharyngitis, unspecified etiology    Suspect  developing left otitis media. Throat culture pending. Begin amoxicillin 875mg  BID. Take plain guaifenesin (1200mg  extended release tabs such as Mucinex) twice daily, with plenty of water, for cough and congestion.  May add Pseudoephedrine (30mg , one or two every 4 to 6 hours) for sinus congestion.  Get adequate rest.   May use Afrin nasal spray (or generic oxymetazoline) twice daily for about 5 days and then discontinue.  Also recommend using saline nasal spray several times daily and saline nasal irrigation (AYR is a common brand).   Try warm salt water gargles for sore throat.  Stop all antihistamines for now, and other non-prescription cough/cold preparations. May take Ibuprofen 200mg , 4 tabs every 8 hours with food for sore throat, body aches, etc. May take Delsym Cough Suppressant at bedtime for nighttime cough.  Follow-up with family doctor if not improving about7 to 10 days.     Kandra Nicolas, MD 11/21/15 2045

## 2015-11-22 ENCOUNTER — Telehealth: Payer: Self-pay | Admitting: *Deleted

## 2015-11-22 LAB — STREP A DNA PROBE: GASP: NOT DETECTED

## 2016-01-27 ENCOUNTER — Encounter: Payer: Self-pay | Admitting: Physician Assistant

## 2016-01-27 ENCOUNTER — Ambulatory Visit (INDEPENDENT_AMBULATORY_CARE_PROVIDER_SITE_OTHER): Payer: Self-pay | Admitting: Physician Assistant

## 2016-01-27 VITALS — BP 116/51 | HR 98 | Temp 98.4°F | Wt 241.0 lb

## 2016-01-27 DIAGNOSIS — B351 Tinea unguium: Secondary | ICD-10-CM | POA: Insufficient documentation

## 2016-01-27 DIAGNOSIS — Z794 Long term (current) use of insulin: Secondary | ICD-10-CM | POA: Insufficient documentation

## 2016-01-27 DIAGNOSIS — E1165 Type 2 diabetes mellitus with hyperglycemia: Secondary | ICD-10-CM | POA: Insufficient documentation

## 2016-01-27 DIAGNOSIS — E1140 Type 2 diabetes mellitus with diabetic neuropathy, unspecified: Secondary | ICD-10-CM

## 2016-01-27 LAB — POCT GLYCOSYLATED HEMOGLOBIN (HGB A1C): HEMOGLOBIN A1C: 12.3

## 2016-01-27 LAB — POCT UA - MICROALBUMIN
Albumin/Creatinine Ratio, Urine, POC: <30
Creatinine, POC: 100 mg/dL
Microalbumin Ur, POC: 10 mg/L

## 2016-01-27 MED ORDER — LISINOPRIL 10 MG PO TABS: 10.0000 mg | ORAL_TABLET | Freq: Every day | ORAL | Status: DC

## 2016-01-27 MED ORDER — FLUCONAZOLE 150 MG PO TABS: 150.0000 mg | ORAL_TABLET | Freq: Once | ORAL | Status: DC

## 2016-01-27 MED ORDER — CANAGLIFLOZIN 300 MG PO TABS: 300.0000 mg | ORAL_TABLET | Freq: Every day | ORAL | Status: DC

## 2016-01-27 MED ORDER — TERBINAFINE HCL 250 MG PO TABS: 250.0000 mg | ORAL_TABLET | Freq: Every day | ORAL | Status: DC

## 2016-01-27 MED ORDER — INSULIN GLARGINE 300 UNIT/ML ~~LOC~~ SOPN: 10.0000 [IU] | PEN_INJECTOR | Freq: Every day | SUBCUTANEOUS | Status: DC

## 2016-01-27 MED ORDER — GLIPIZIDE 10 MG PO TABS: 10.0000 mg | ORAL_TABLET | Freq: Two times a day (BID) | ORAL | Status: DC

## 2016-01-27 MED ORDER — SERTRALINE HCL 50 MG PO TABS: 50.0000 mg | ORAL_TABLET | Freq: Every day | ORAL | Status: DC

## 2016-01-27 NOTE — Patient Instructions (Addendum)
Start back on all medications. Follow up in 3 months.   Nail Ringworm A fungal infection of the nail (tinea unguium/onychomycosis) is common. It is common as the visible part of the nail is composed of dead cells which have no blood supply to help prevent infection. It occurs because fungi are everywhere and will pick any opportunity to grow on any dead material. Because nails are very slow growing they require up to 2 years of treatment with anti-fungal medications. The entire nail back to the base is infected. This includes approximately  of the nail which you cannot see. If your caregiver has prescribed a medication by mouth, take it every day and as directed. No progress will be seen for at least 6 to 9 months. Do not be disappointed! Because fungi live on dead cells with little or no exposure to blood supply, medication delivery to the infection is slow; thus the cure is slow. It is also why you can observe no progress in the first 6 months. The nail becoming cured is the base of the nail, as it has the blood supply. Topical medication such as creams and ointments are usually not effective. Important in successful treatment of nail fungus is closely following the medication regimen that your doctor prescribes. Sometimes you and your caregiver may elect to speed up this process by surgical removal of all the nails. Even this may still require 6 to 9 months of additional oral medications. See your caregiver as directed. Remember there will be no visible improvement for at least 6 months. See your caregiver sooner if other signs of infection (redness and swelling) develop.   This information is not intended to replace advice given to you by your health care provider. Make sure you discuss any questions you have with your health care provider.   Document Released: 10/09/2000 Document Revised: 02/26/2015 Document Reviewed: 04/15/2015 Elsevier Interactive Patient Education [employer].

## 2016-01-27 NOTE — Progress Notes (Signed)
   Subjective:    Patient ID: Denise Tyler, female    DOB: 1966/10/17, 50 y.o.   MRN: [id number]  HPI  Pt presents to the clinic for right great toe dark and thick toenail. She has tried OTC nail fungus treatment with no relief. She is also concerned because she is also having some bilateral feet burning and tingling.   Pt was dx as diabetic over a year ago. She has not been seen since. She has not been on any medication lately. She is not checking sugars. She feels like "crap   Review of Systems  All other systems reviewed and are negative.      Objective:   Physical Exam  Constitutional: She is oriented to person, place, and time. She appears well-developed and well-nourished.  Obese.   HENT:  Head: Normocephalic and atraumatic.  Cardiovascular: Normal rate, regular rhythm and normal heart sounds.   Pulmonary/Chest: Effort normal and breath sounds normal.  Neurological: She is alert and oriented to person, place, and time.  Skin: Skin is dry.  Thick dark yellow great right toe. No erythema or pus surrounding.   Psychiatric: She has a normal mood and affect. Her behavior is normal.          Assessment & Plan:  Onychomycosis- lamasil given for fungus for 12 weeks. HO for prevention.   DM- .[name] Kitchen Lab Results  Component Value Date   HGBA1C 12.3 01/27/2016   Very out of control.  Restarted ALL medications.  Follow up in 3 months.  Discussed SHE MUST TAKE this seriously.  Encouraged low carb/sugar diet.  On STATIN.  Pt aware when controlled will feel much better.   .. Results for orders placed or performed in visit on 01/27/16  POCT HgB A1C  Result Value Ref Range   Hemoglobin A1C 12.3   POCT UA - Microalbumin  Result Value Ref Range   Microalbumin Ur, POC 10 mg/L   Creatinine, POC 100 mg/dL   Albumin/Creatinine Ratio, Urine, POC <30       Vaginal discharge- diflucan given for once.

## 2016-03-30 ENCOUNTER — Encounter: Payer: Self-pay | Admitting: Physician Assistant

## 2016-03-30 ENCOUNTER — Ambulatory Visit (INDEPENDENT_AMBULATORY_CARE_PROVIDER_SITE_OTHER): Payer: Self-pay | Admitting: Physician Assistant

## 2016-03-30 ENCOUNTER — Ambulatory Visit (INDEPENDENT_AMBULATORY_CARE_PROVIDER_SITE_OTHER): Payer: Self-pay

## 2016-03-30 VITALS — BP 140/99 | HR 78 | Ht 67.0 in | Wt 237.0 lb

## 2016-03-30 DIAGNOSIS — K921 Melena: Secondary | ICD-10-CM

## 2016-03-30 DIAGNOSIS — R1013 Epigastric pain: Secondary | ICD-10-CM

## 2016-03-30 DIAGNOSIS — M549 Dorsalgia, unspecified: Secondary | ICD-10-CM

## 2016-03-30 DIAGNOSIS — R1011 Right upper quadrant pain: Secondary | ICD-10-CM

## 2016-03-30 MED ORDER — SUCRALFATE 1 G PO TABS: 1.0000 g | ORAL_TABLET | Freq: Two times a day (BID) | ORAL | Status: DC

## 2016-03-30 MED ORDER — OMEPRAZOLE 40 MG PO CPDR: 40.0000 mg | DELAYED_RELEASE_CAPSULE | Freq: Every day | ORAL | Status: DC

## 2016-03-30 NOTE — Progress Notes (Signed)
   Subjective:    Patient ID: Denise Tyler, female    DOB: 05/12/1966, 50 y.o.   MRN: [id number]  HPI  Pt presents to the clinic with upper abdominal pain that radiates into right mid back. She does not have her gallbladder. She has hx of abdominal pain with constipation. She has taken OTC laxatives but not tried miralax. Increased pain started about 1 month ago. Rates pain 5/10 and sharp and burning. Reports hx of off and on black tarry stools. Movement seems to make worse along with laying on left side. Feels a pressure in abdomen. Food does not bother it. Ate a sausage biscuit this am. Denies any heart burn or acid reflux. Hard stools every 2-3 days.   DM taking toujeo up to 22 units. Not checking glucose regularly. 2 weeks ago sugars were checked and was 240.    Review of Systems See HPI.     Objective:   Physical Exam  Constitutional: She is oriented to person, place, and time. She appears well-developed and well-nourished.  HENT:  Head: Normocephalic and atraumatic.  Cardiovascular: Normal rate, regular rhythm and normal heart sounds.   Pulmonary/Chest: Effort normal and breath sounds normal.  Right CVA tenderness.   Abdominal: Soft. Bowel sounds are normal. She exhibits no distension and no mass. There is tenderness. There is no rebound and no guarding.  Tenderness with very mild guarding with deep palpation.  Tenderness without guarding RUQ.   Neurological: She is alert and oriented to person, place, and time.  Psychiatric: She has a normal mood and affect. Her behavior is normal.          Assessment & Plan:  RUQ/epigastric pain with radiation to back/black tarry stools/constipation- unclear etiology. Hx of cholecystectomy and constipation. suscpious of PUD with black tarry stools and perhaps some referred pain to back. Will get u/s of abdomen to include kidney. GI cocktail given in office today. No insurance declined h.pylori testing. Start omeprazole and carafate. Discussed  ulcers can take up to 8 weeks to heal. miralax daily for constipation. cmp ordered. Follow up with red flags signs discussed today. Follow up in 4 weeks.    DM- has follow up appt next month. Due for A!C. On toujeo and glipizide. Could not tolerate metformin. Not checking sugars regularly. Encouraged to start at least checking fasting sugars every 3-4 days.

## 2016-03-31 ENCOUNTER — Other Ambulatory Visit: Payer: Self-pay | Admitting: *Deleted

## 2016-03-31 LAB — COMPLETE METABOLIC PANEL WITH GFR
ALBUMIN: 4.1 g/dL (ref 3.6–5.1)
ALK PHOS: 108 U/L (ref 33–115)
ALT: 20 U/L (ref 6–29)
AST: 10 U/L (ref 10–35)
BILIRUBIN TOTAL: 0.5 mg/dL (ref 0.2–1.2)
BUN: 11 mg/dL (ref 7–25)
CALCIUM: 9.1 mg/dL (ref 8.6–10.2)
CO2: 27 mmol/L (ref 20–31)
Chloride: 101 mmol/L (ref 98–110)
Creat: 0.61 mg/dL (ref 0.50–1.10)
GLUCOSE: 228 mg/dL — AB (ref 65–99)
POTASSIUM: 4 mmol/L (ref 3.5–5.3)
SODIUM: 137 mmol/L (ref 135–146)
TOTAL PROTEIN: 6.6 g/dL (ref 6.1–8.1)

## 2016-03-31 MED ORDER — INSULIN GLARGINE 300 UNIT/ML ~~LOC~~ SOPN: 10.0000 [IU] | PEN_INJECTOR | Freq: Every day | SUBCUTANEOUS | Status: DC

## 2016-06-02 ENCOUNTER — Ambulatory Visit: Payer: Self-pay | Admitting: Physician Assistant

## 2016-12-25 ENCOUNTER — Encounter: Payer: Self-pay | Admitting: Physician Assistant

## 2016-12-25 ENCOUNTER — Ambulatory Visit (INDEPENDENT_AMBULATORY_CARE_PROVIDER_SITE_OTHER): Payer: Self-pay | Admitting: Physician Assistant

## 2016-12-25 VITALS — BP 138/86 | HR 91 | Wt 238.0 lb

## 2016-12-25 DIAGNOSIS — Z13220 Encounter for screening for lipoid disorders: Secondary | ICD-10-CM

## 2016-12-25 DIAGNOSIS — E1140 Type 2 diabetes mellitus with diabetic neuropathy, unspecified: Secondary | ICD-10-CM

## 2016-12-25 DIAGNOSIS — Z131 Encounter for screening for diabetes mellitus: Secondary | ICD-10-CM

## 2016-12-25 DIAGNOSIS — G4700 Insomnia, unspecified: Secondary | ICD-10-CM | POA: Insufficient documentation

## 2016-12-25 DIAGNOSIS — M2550 Pain in unspecified joint: Secondary | ICD-10-CM | POA: Insufficient documentation

## 2016-12-25 DIAGNOSIS — Z794 Long term (current) use of insulin: Secondary | ICD-10-CM

## 2016-12-25 DIAGNOSIS — Z79899 Other long term (current) drug therapy: Secondary | ICD-10-CM

## 2016-12-25 DIAGNOSIS — F411 Generalized anxiety disorder: Secondary | ICD-10-CM

## 2016-12-25 MED ORDER — GLIPIZIDE 10 MG PO TABS: 10.0000 mg | ORAL_TABLET | Freq: Two times a day (BID) | ORAL | 2 refills | Status: DC

## 2016-12-25 MED ORDER — LISINOPRIL 5 MG PO TABS: 5.0000 mg | ORAL_TABLET | Freq: Every day | ORAL | 3 refills | Status: DC

## 2016-12-25 MED ORDER — TRAZODONE HCL 50 MG PO TABS: 25.0000 mg | ORAL_TABLET | Freq: Every evening | ORAL | 3 refills | Status: DC | PRN

## 2016-12-25 MED ORDER — LORAZEPAM 0.5 MG PO TABS: 0.5000 mg | ORAL_TABLET | Freq: Three times a day (TID) | ORAL | 1 refills | Status: DC | PRN

## 2016-12-25 MED ORDER — INSULIN GLARGINE 300 UNIT/ML ~~LOC~~ SOPN: 10.0000 [IU] | PEN_INJECTOR | Freq: Every day | SUBCUTANEOUS | 2 refills | Status: DC

## 2016-12-25 NOTE — Progress Notes (Addendum)
Subjective:     Patient ID: Denise Tyler, female   DOB: [birth year], 51 y.o.   MRN: [id number]  HPI  This is a 51 year old female who presents for diabetes management. Last A1C was 01/27/16 and was 12.3, today 8.7. Patient is occasionally checking daily blood sugars, reports this morning it was 356. Currently prescribed Glipizide 10 mg and Insulin Glargine 300 units however patient states she has been out of insulin for about 2 months and ran out of glipizide within the past month. Patient reports that insulin use gave her yeast infections and these have resolved since stopping the insulin.  No episodes of hypoglycemia. Numbness and tingling over the whole foot.  Patient relays she is very stressed at work and is requesting something to help for acute anxiety. She states she does not want to take a daily medication because she previously responded poorly to Zoloft. Relays episodes of panic and anxiety which resulted in vomiting.   Takes 2 Alleves in the morning and 2 at night for shoulder, knee, and hip pain.  Blood pressure 140/89 today. She is not taking her Lisinopril, has not been for 2 years.  Last foot exam: 1 year ago Last eye exam: patient states she has never had one.  Review of Systems See HPI.    Objective:   Physical Exam  Constitutional: She is oriented to person, place, and time. She appears well-developed and well-nourished.  HENT:  Head: Normocephalic and atraumatic.  Neck: Normal range of motion. Neck supple.  Cardiovascular: Normal rate and regular rhythm.  Exam reveals no gallop and no friction rub.   No murmur heard. Pulmonary/Chest: Effort normal. She has no wheezes. She has no rales.  Musculoskeletal: Normal range of motion.  Neurological: She is alert and oriented to person, place, and time.  Skin: Skin is warm and dry.  No lesions on bilateral feet. Fungus noted on first metatarsal of right foot. Decreased sensation to touch.  Psychiatric: She has a normal mood and  affect. Her behavior is normal.   Depression screen PHQ 2/9 12/25/2016  Decreased Interest 1  Down, Depressed, Hopeless 1  PHQ - 2 Score 2  Altered sleeping 3  Tired, decreased energy 3  Change in appetite 1  Feeling bad or failure about yourself  0  Trouble concentrating 1  Moving slowly or fidgety/restless 2  Suicidal thoughts 0  PHQ-9 Score 12   GAD 7 : Generalized Anxiety Score 12/25/2016  Nervous, Anxious, on Edge 3  Control/stop worrying 3  Worry too much - different things 3  Trouble relaxing 3  Restless 3  Easily annoyed or irritable 3  Afraid - awful might happen 3  Total GAD 7 Score 21        Assessment/Plan:  Naliya was seen today for diabetes.  Diagnoses and all orders for this visit:  Type 2 diabetes mellitus with diabetic neuropathy, with long-term current use of insulin (HCC) -     COMPLETE METABOLIC PANEL WITH GFR -     lisinopril (PRINIVIL,ZESTRIL) 5 MG tablet; Take 1 tablet (5 mg total) by mouth daily. -     glipiZIDE (GLUCOTROL) 10 MG tablet; Take 1 tablet (10 mg total) by mouth 2 (two) times daily before a meal. -     Insulin Glargine (TOUJEO SOLOSTAR) 300 UNIT/ML SOPN; Inject 10 Units into the skin at bedtime. Increase by 2 units every 5 days until fasting glucose is 100-120.  Screening for diabetes mellitus  Medication management -  COMPLETE METABOLIC PANEL WITH GFR -     CBC with Differential  Screening for lipid disorders -     Lipid Profile  Anxiety state -     LORazepam (ATIVAN) 0.5 MG tablet; Take 1 tablet (0.5 mg total) by mouth every 8 (eight) hours as needed for anxiety.  Insomnia, unspecified type -     traZODone (DESYREL) 50 MG tablet; Take 0.5-1 tablets (25-50 mg total) by mouth at bedtime as needed for sleep.  Multiple joint pain  - Patient's A1C 8.7 today. Refilled medication and instructed her to begin taking medication again. Recommended starting one medication before the other in order to isolate which medication could be  causing her frequent yeast infections. If these yeast infections continue to occur, instructed patient to call if this continues to happen and we can try and switch her medications. Return in 3 months for recheck of diabetes management.  - Instructed to get eye exam, patient states she will consider however does not currently have insurance. - PHQ-9 12 and GAD-7 21 today. Given situational anxiety and clear stressors, prescribed Ativan PRN. Counseled patient on addictive nature of Ativan and importance of only taking it a few times a week when panic symptoms occur. Patient declined daily medication. Discussed referral to counselor however patient did not appear to be interested. - Concerning joint pain, discussed prescribing high strength NSAID pending kidney function labs. Also discussed possible referral to Sports Medicine if unable to continue taking NSAIDs due to kidney damage. Encouraged tumeric, glucosamine chondrotin. - On blood pressure recheck pressure was 138/86. Due to previous microalbumin in urine, however, Lisinopril was refilled for renal protection.

## 2016-12-25 NOTE — Patient Instructions (Addendum)
Tumeric 500mg  twice a day.  Glucosamine chondrotin Fish oil 4000mg  daily.

## 2016-12-31 ENCOUNTER — Other Ambulatory Visit: Payer: Self-pay | Admitting: Physician Assistant

## 2017-01-01 ENCOUNTER — Encounter: Payer: Self-pay | Admitting: Physician Assistant

## 2017-01-01 DIAGNOSIS — E781 Pure hyperglyceridemia: Secondary | ICD-10-CM | POA: Insufficient documentation

## 2017-01-01 LAB — LIPID PANEL
CHOLESTEROL: 220 mg/dL — AB (ref <200)
HDL: 21 mg/dL — ABNORMAL LOW (ref >50)
Total CHOL/HDL Ratio: 10.5 Ratio — ABNORMAL HIGH (ref <5.0)
Triglycerides: 577 mg/dL — ABNORMAL HIGH (ref <150)

## 2017-01-01 LAB — COMPLETE METABOLIC PANEL WITH GFR
ALBUMIN: 4 g/dL (ref 3.6–5.1)
ALK PHOS: 90 U/L (ref 33–130)
ALT: 19 U/L (ref 6–29)
AST: 9 U/L — ABNORMAL LOW (ref 10–35)
BUN: 13 mg/dL (ref 7–25)
CO2: 27 mmol/L (ref 20–31)
Calcium: 9.1 mg/dL (ref 8.6–10.4)
Chloride: 106 mmol/L (ref 98–110)
Creat: 0.84 mg/dL (ref 0.50–1.05)
GFR, Est African American: >89 mL/min (ref >=60)
GFR, Est Non African American: 81 mL/min (ref >=60)
GLUCOSE: 251 mg/dL — AB (ref 65–99)
Potassium: 3.7 mmol/L (ref 3.5–5.3)
SODIUM: 138 mmol/L (ref 135–146)
TOTAL PROTEIN: 6.5 g/dL (ref 6.1–8.1)
Total Bilirubin: 0.4 mg/dL (ref 0.2–1.2)

## 2017-01-01 LAB — CBC WITH DIFFERENTIAL/PLATELET
Basophils Absolute: 81 cells/uL (ref 0–200)
Basophils Relative: 1 %
EOS ABS: 243 {cells}/uL (ref 15–500)
EOS PCT: 3 %
HCT: 40.7 % (ref 35.0–45.0)
Hemoglobin: 13.5 g/dL (ref 11.7–15.5)
Lymphocytes Relative: 31 %
Lymphs Abs: 2511 cells/uL (ref 850–3900)
MCH: 27.4 pg (ref 27.0–33.0)
MCHC: 33.2 g/dL (ref 32.0–36.0)
MCV: 82.7 fL (ref 80.0–100.0)
MONOS PCT: 6 %
MPV: 11.3 fL (ref 7.5–12.5)
Monocytes Absolute: 486 cells/uL (ref 200–950)
Neutro Abs: 4779 cells/uL (ref 1500–7800)
Neutrophils Relative %: 59 %
Platelets: 244 10*3/uL (ref 140–400)
RBC: 4.92 MIL/uL (ref 3.80–5.10)
RDW: 14 % (ref 11.0–15.0)
WBC: 8.1 10*3/uL (ref 3.8–10.8)

## 2017-01-01 NOTE — Progress Notes (Signed)
Call pt: kidney looks great.  TG really elevated. Do you have rx prescription plan? Could we send over prescription fish oil.  Please add direct LDL.

## 2017-01-04 LAB — LDL CHOLESTEROL, DIRECT: LDL DIRECT: 136 mg/dL — AB (ref <130)

## 2017-01-05 MED ORDER — ATORVASTATIN CALCIUM 40 MG PO TABS: 40.0000 mg | ORAL_TABLET | Freq: Every day | ORAL | 3 refills | Status: DC

## 2017-01-05 NOTE — Addendum Note (Signed)
Addended by: Donella Stade on: 01/05/2017 04:59 PM   Modules accepted: Orders

## 2017-04-23 ENCOUNTER — Ambulatory Visit: Payer: Self-pay | Admitting: Physician Assistant

## 2017-07-07 ENCOUNTER — Encounter: Payer: Self-pay | Admitting: Physician Assistant

## 2017-07-07 ENCOUNTER — Ambulatory Visit (INDEPENDENT_AMBULATORY_CARE_PROVIDER_SITE_OTHER): Payer: Self-pay

## 2017-07-07 ENCOUNTER — Ambulatory Visit (INDEPENDENT_AMBULATORY_CARE_PROVIDER_SITE_OTHER): Payer: Self-pay | Admitting: Physician Assistant

## 2017-07-07 VITALS — BP 145/86 | HR 68 | Wt 228.0 lb

## 2017-07-07 DIAGNOSIS — M25551 Pain in right hip: Secondary | ICD-10-CM

## 2017-07-07 DIAGNOSIS — M79651 Pain in right thigh: Secondary | ICD-10-CM

## 2017-07-07 DIAGNOSIS — F411 Generalized anxiety disorder: Secondary | ICD-10-CM

## 2017-07-07 DIAGNOSIS — E785 Hyperlipidemia, unspecified: Secondary | ICD-10-CM

## 2017-07-07 DIAGNOSIS — Z794 Long term (current) use of insulin: Secondary | ICD-10-CM

## 2017-07-07 DIAGNOSIS — R1013 Epigastric pain: Secondary | ICD-10-CM

## 2017-07-07 DIAGNOSIS — M1611 Unilateral primary osteoarthritis, right hip: Secondary | ICD-10-CM

## 2017-07-07 DIAGNOSIS — E781 Pure hyperglyceridemia: Secondary | ICD-10-CM

## 2017-07-07 DIAGNOSIS — M79604 Pain in right leg: Secondary | ICD-10-CM

## 2017-07-07 DIAGNOSIS — G4700 Insomnia, unspecified: Secondary | ICD-10-CM

## 2017-07-07 DIAGNOSIS — E1165 Type 2 diabetes mellitus with hyperglycemia: Secondary | ICD-10-CM

## 2017-07-07 DIAGNOSIS — E1169 Type 2 diabetes mellitus with other specified complication: Secondary | ICD-10-CM

## 2017-07-07 LAB — POCT GLYCOSYLATED HEMOGLOBIN (HGB A1C): Hemoglobin A1C: 11.1

## 2017-07-07 LAB — COMPLETE METABOLIC PANEL WITH GFR
AG RATIO: 1.8 (calc) (ref 1.0–2.5)
ALBUMIN MSPROF: 4.2 g/dL (ref 3.6–5.1)
ALKALINE PHOSPHATASE (APISO): 118 U/L (ref 33–130)
ALT: 34 U/L — ABNORMAL HIGH (ref 6–29)
AST: 19 U/L (ref 10–35)
BILIRUBIN TOTAL: 0.5 mg/dL (ref 0.2–1.2)
BUN: 9 mg/dL (ref 7–25)
CHLORIDE: 102 mmol/L (ref 98–110)
CO2: 25 mmol/L (ref 20–32)
Calcium: 9.2 mg/dL (ref 8.6–10.4)
Creat: 0.7 mg/dL (ref 0.50–1.05)
GFR, EST AFRICAN AMERICAN: 116 mL/min/{1.73_m2} (ref >=60)
GFR, Est Non African American: 100 mL/min/{1.73_m2} (ref >=60)
GLOBULIN: 2.3 g/dL (ref 1.9–3.7)
Glucose, Bld: 301 mg/dL — ABNORMAL HIGH (ref 65–99)
POTASSIUM: 3.9 mmol/L (ref 3.5–5.3)
SODIUM: 136 mmol/L (ref 135–146)
TOTAL PROTEIN: 6.5 g/dL (ref 6.1–8.1)

## 2017-07-07 LAB — CBC WITH DIFFERENTIAL/PLATELET
BASOS PCT: 0.9 %
Basophils Absolute: 74 cells/uL (ref 0–200)
EOS ABS: 0 {cells}/uL — AB (ref 15–500)
EOS PCT: 0 %
HCT: 42.1 % (ref 35.0–45.0)
HEMOGLOBIN: 14.3 g/dL (ref 11.7–15.5)
Lymphs Abs: 2107 cells/uL (ref 850–3900)
MCH: 27.9 pg (ref 27.0–33.0)
MCHC: 34 g/dL (ref 32.0–36.0)
MCV: 82.1 fL (ref 80.0–100.0)
MONOS PCT: 5.6 %
MPV: 11.7 fL (ref 7.5–12.5)
NEUTROS ABS: 5560 {cells}/uL (ref 1500–7800)
Neutrophils Relative %: 67.8 %
Platelets: 236 10*3/uL (ref 140–400)
RBC: 5.13 10*6/uL — ABNORMAL HIGH (ref 3.80–5.10)
RDW: 13 % (ref 11.0–15.0)
TOTAL LYMPHOCYTE: 25.7 %
WBC mixed population: 459 cells/uL (ref 200–950)
WBC: 8.2 10*3/uL (ref 3.8–10.8)

## 2017-07-07 LAB — LIPASE: Lipase: 35 U/L (ref 7–60)

## 2017-07-07 MED ORDER — LISINOPRIL 5 MG PO TABS: 5.0000 mg | ORAL_TABLET | Freq: Every day | ORAL | 3 refills | Status: DC

## 2017-07-07 MED ORDER — TRAZODONE HCL 50 MG PO TABS: 25.0000 mg | ORAL_TABLET | Freq: Every evening | ORAL | 3 refills | Status: DC | PRN

## 2017-07-07 MED ORDER — LORAZEPAM 0.5 MG PO TABS: 0.5000 mg | ORAL_TABLET | Freq: Three times a day (TID) | ORAL | 1 refills | Status: DC | PRN

## 2017-07-07 MED ORDER — ATORVASTATIN CALCIUM 40 MG PO TABS: 40.0000 mg | ORAL_TABLET | Freq: Every day | ORAL | 3 refills | Status: DC

## 2017-07-07 MED ORDER — INSULIN GLARGINE 300 UNIT/ML ~~LOC~~ SOPN: 10.0000 [IU] | PEN_INJECTOR | Freq: Every day | SUBCUTANEOUS | 2 refills | Status: DC

## 2017-07-07 MED ORDER — GLIPIZIDE 10 MG PO TABS: 10.0000 mg | ORAL_TABLET | Freq: Two times a day (BID) | ORAL | 2 refills | Status: DC

## 2017-07-07 MED ORDER — OMEPRAZOLE 40 MG PO CPDR: 40.0000 mg | DELAYED_RELEASE_CAPSULE | Freq: Every day | ORAL | 3 refills | Status: DC

## 2017-07-07 NOTE — Progress Notes (Signed)
Subjective:    Patient ID: Denise Tyler, female    DOB: December 23, 1965, 51 y.o.   MRN: [id number]  HPI  Pt is a 51 yo obese female who has DM and is non compliant who presents today to follow up and discuss right leg and hip pain as well as epigastric pain.   DM- last year a1c was 12.3. She has not been taking toujeo because she ran out. She is taking glipizide. No hypoglycemia feelings. Not checking sugars. No open sores or wounds.  She is not exercising and feels SOB when exercising.   Hyperlipidemia- LDL was 126 not taking statin.   She has had pain of right anterior leg and hip for last few weeks. She denies any injury. She is taking aleve regularly and helps pain. Denies any back pain. No numbness or tingling in leg.   She is also having epigastric pain for last few weeks. She admits to being constipated and bloated. She has not had a good bowel movement in 5 days. She ate 7 hashrounds this am and she feels nauseated and bloated with pain. She denies any acid reflux. At times pain is behind umbilicus and can radiate into back.   .. Active Ambulatory Problems    Diagnosis Date Noted  . ANKLE PAIN 02/02/2011  . GERD (gastroesophageal reflux disease) 11/08/2013  . Epigastric pain 11/08/2013  . Diabetes mellitus type II, controlled ([place]) 11/08/2013  . Hepatic steatosis 11/27/2014  . Adenoma of left adrenal gland 11/27/2014  . Constipation 11/27/2014  . Type 2 diabetes mellitus with hyperglycemia, with long-term current use of insulin ([place]) 01/27/2016  . Onychomycosis 01/27/2016  . Black tarry stools 03/30/2016  . CVA tenderness 03/30/2016  . Anxiety state 12/25/2016  . Insomnia 12/25/2016  . Multiple joint pain 12/25/2016  . Hypertriglyceridemia 01/01/2017  . Right hip pain 07/11/2017  . Hyperlipidemia associated with type 2 diabetes mellitus ([place]) 07/13/2017   Resolved Ambulatory Problems    Diagnosis Date Noted  . No Resolved Ambulatory Problems   Past Medical History:   Diagnosis Date  . Diabetes mellitus without complication ([place])   . Mini stroke [facility])       Review of Systems  All other systems reviewed and are negative.      Objective:   Physical Exam  Constitutional: She is oriented to person, place, and time. She appears well-developed and well-nourished.  HENT:  Head: Normocephalic and atraumatic.  Cardiovascular: Normal rate, regular rhythm and normal heart sounds.   Pulmonary/Chest: Effort normal and breath sounds normal. She has no wheezes.  Abdominal: Soft. Bowel sounds are normal. She exhibits no distension and no mass. There is tenderness.  Moderate pain with palpation over epigastric area. No guarding or rebound.   Musculoskeletal:  NROM of right hip.  No tenderness to palpation over right leg.  Negative straight leg test.  Strength 5/5 of lower extermity.   Neurological: She is alert and oriented to person, place, and time.  Psychiatric: She has a normal mood and affect. Her behavior is normal.          Assessment & Plan:  [name] KitchenMarland KitchenAritha was seen today for feels full after eating.  Diagnoses and all orders for this visit:  Type 2 diabetes mellitus with hyperglycemia, with long-term current use of insulin (HCC) -     POCT HgB A1C -     Insulin Glargine (TOUJEO SOLOSTAR) 300 UNIT/ML SOPN; Inject 10 Units into the skin at bedtime. Increase by 2 units every 5 days  until fasting glucose is 100-120. -     glipiZIDE (GLUCOTROL) 10 MG tablet; Take 1 tablet (10 mg total) by mouth 2 (two) times daily before a meal. -     lisinopril (PRINIVIL,ZESTRIL) 5 MG tablet; Take 1 tablet (5 mg total) by mouth daily.  Insomnia, unspecified type -     traZODone (DESYREL) 50 MG tablet; Take 0.5-1 tablets (25-50 mg total) by mouth at bedtime as needed for sleep.  Anxiety state -     LORazepam (ATIVAN) 0.5 MG tablet; Take 1 tablet (0.5 mg total) by mouth every 8 (eight) hours as needed for anxiety.  Epigastric pain -     Lipase -     COMPLETE  METABOLIC PANEL WITH GFR -     CBC with Differential/Platelet -     omeprazole (PRILOSEC) 40 MG capsule; Take 1 capsule (40 mg total) by mouth daily.  Right leg pain -     DG FEMUR 1V RIGHT; Future -     DG FEMUR 1V RIGHT  Right hip pain -     DG HIP UNILAT WITH PELVIS 1V RIGHT; Future -     DG HIP UNILAT WITH PELVIS 1V RIGHT  Hypertriglyceridemia  Hyperlipidemia associated with type 2 diabetes mellitus (HCC) -     atorvastatin (LIPITOR) 40 MG tablet; Take 1 tablet (40 mg total) by mouth daily.   Xray of femur and hip negative. Except for some mild degenerative changes of hip.  Discussed ROM exercises. NSAIDs as needed. Follow up with sports medicine.   .. Depression screen [facility] 2/9 07/07/2017 12/25/2016  Decreased Interest 1 1  Down, Depressed, Hopeless 1 1  PHQ - 2 Score 2 2  Altered sleeping - 3  Tired, decreased energy - 3  Change in appetite - 1  Feeling bad or failure about yourself  - 0  Trouble concentrating - 1  Moving slowly or fidgety/restless - 2  Suicidal thoughts - 0  PHQ-9 Score - 12   .[name] Kitchen Lab Results  Component Value Date   HGBA1C 11.1 07/07/2017   DM still uncontrolled.  Pt is not taking toujeo like she should and does not have insurance.  Start taking toujeo and get fasting sugars to 100.  Pt declined flu and pneumonia vaccine.  She cannot afford eye exam.   Epigastric pain. Labs ordered to rule out pancreatitis. Cholecystitis vs PUD(due to NSAID use) vs gastroparesis due to uncontrolled DM of pain. She does have constipation that can also be painful. If CBC normal consider CT scan. Pt does not have insurance. Discussed dulcolax and miralax to help with bowel movements. Start omeprazole to see if helps coat stomach from NSAID use for arthritis. Follow up as needed.

## 2017-07-07 NOTE — Progress Notes (Signed)
Normal xray of femur.

## 2017-07-07 NOTE — Progress Notes (Signed)
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 NSAIDs and strengthing muscle around hip.

## 2017-07-07 NOTE — Patient Instructions (Signed)
Gastroparesis °Gastroparesis, also called delayed gastric emptying, is a condition in which food takes longer than normal to empty from the stomach. The condition is usually long-lasting (chronic). °What are the causes? °This condition may be caused by: °· An endocrine disorder, such as hypothyroidism or diabetes. Diabetes is the most common cause of this condition. °· A nervous system disease, such as Parkinson disease or multiple sclerosis. °· Cancer, infection, or surgery of the stomach or vagus nerve. °· A connective tissue disorder, such as scleroderma. °· Certain medicines. ° °In most cases, the cause is not known. °What increases the risk? °This condition is more likely to develop in: °· People with certain disorders, including endocrine disorders, eating disorders, amyloidosis, and scleroderma. °· People with certain diseases, including Parkinson disease or multiple sclerosis. °· People with cancer or infection of the stomach or vagus nerve. °· People who have had surgery on the stomach or vagus nerve. °· People who take certain medicines. °· Women. ° °What are the signs or symptoms? °Symptoms of this condition include: °· An early feeling of fullness when eating. °· Nausea. °· Weight loss. °· Vomiting. °· Heartburn. °· Abdominal bloating. °· Inconsistent blood glucose levels. °· Lack of appetite. °· Acid from the stomach coming up into the esophagus (gastroesophageal reflux). °· Spasms of the stomach. ° °Symptoms may come and go. °How is this diagnosed? °This condition is diagnosed with tests, such as: °· Tests that check how long it takes food to move through the stomach and intestines. These tests include: °? Upper gastrointestinal (GI) series. In this test, X-rays of the intestines are taken after you drink a liquid. The liquid makes the intestines show up better on the X-rays. °? Gastric emptying scintigraphy. In this test, scans are taken after you eat food that contains a small amount of radioactive  material. °? Wireless capsule GI monitoring system. This test involves swallowing a capsule that records information about movement through the stomach. °· Gastric manometry. This test measures electrical and muscular activity in the stomach. It is done with a thin tube that is passed down the throat and into the stomach. °· Endoscopy. This test checks for abnormalities in the lining of the stomach. It is done with a long, thin tube that is passed down the throat and into the stomach. °· An ultrasound. This test can help rule out gallbladder disease or pancreatitis as a cause of your symptoms. It uses sound waves to take pictures of the inside of your body. ° °How is this treated? °There is no cure for gastroparesis. This condition may be managed with: °· Treatment of the underlying condition causing the gastroparesis. °· Lifestyle changes, including exercise and dietary changes. Dietary changes can include: °? Changes in what and when you eat. °? Eating smaller meals more often. °? Eating low-fat foods. °? Eating low-fiber forms of high-fiber foods, such as cooked vegetables instead of raw vegetables. °? Having liquid foods in place of solid foods. Liquid foods are easier to digest. °· Medicines. These may be given to control nausea and vomiting and to stimulate stomach muscles. °· Getting food through a feeding tube. This may be done in severe cases. °· A gastric neurostimulator. This is a device that is inserted into the body with surgery. It helps improve stomach emptying and control nausea and vomiting. ° °Follow these instructions at home: °· Follow your health care provider's instructions about exercise and diet. °· Take medicines only as directed by your health care provider. °Contact a   health care provider if: °· Your symptoms do not improve with treatment. °· You have new symptoms. °Get help right away if: °· You have severe abdominal pain that does not improve with treatment. °· You have nausea that does  not go away. °· You cannot keep fluids down. °This information is not intended to replace advice given to you by your health care provider. Make sure you discuss any questions you have with your health care provider. °Document Released: 10/12/2005 Document Revised: 03/19/2016 Document Reviewed: 10/08/2014 °Elsevier Interactive Patient Education © 2018 [employer] ° °

## 2017-07-11 ENCOUNTER — Encounter: Payer: Self-pay | Admitting: Physician Assistant

## 2017-07-11 DIAGNOSIS — M25551 Pain in right hip: Secondary | ICD-10-CM | POA: Insufficient documentation

## 2017-07-13 DIAGNOSIS — E785 Hyperlipidemia, unspecified: Secondary | ICD-10-CM

## 2017-07-13 DIAGNOSIS — M1611 Unilateral primary osteoarthritis, right hip: Secondary | ICD-10-CM | POA: Insufficient documentation

## 2017-07-13 DIAGNOSIS — E1169 Type 2 diabetes mellitus with other specified complication: Secondary | ICD-10-CM | POA: Insufficient documentation

## 2017-07-13 NOTE — Progress Notes (Signed)
Also due to aleve use could be ulcer forming. STOP aleve.

## 2017-07-14 ENCOUNTER — Other Ambulatory Visit: Payer: Self-pay | Admitting: Physician Assistant

## 2017-07-14 DIAGNOSIS — R1013 Epigastric pain: Secondary | ICD-10-CM

## 2017-10-06 ENCOUNTER — Ambulatory Visit: Payer: Self-pay | Admitting: Physician Assistant

## 2017-10-06 DIAGNOSIS — Z0189 Encounter for other specified special examinations: Secondary | ICD-10-CM

## 2017-11-23 ENCOUNTER — Telehealth: Payer: Self-pay | Admitting: Physician Assistant

## 2017-11-23 DIAGNOSIS — E1165 Type 2 diabetes mellitus with hyperglycemia: Secondary | ICD-10-CM

## 2017-11-23 DIAGNOSIS — Z794 Long term (current) use of insulin: Principal | ICD-10-CM

## 2017-11-23 MED ORDER — INSULIN GLARGINE 300 UNIT/ML ~~LOC~~ SOPN: 10.0000 [IU] | PEN_INJECTOR | Freq: Every day | SUBCUTANEOUS | 0 refills | Status: DC

## 2017-11-23 NOTE — Telephone Encounter (Signed)
Pt called to request a refill on her insulin. I advised I would sent her a 30 day supply, but that she is overdue for follow up. Pt states she is aware she needs to come in, but she is embarrassed to see PCP again. She reports at her last OV, she said a cuss word in front of PCP then tried to apologize when she realized PCP husband was a [employer] manager. I advised the Pt that [place] hears worse than that, and there is no need to be embarrassed. Pt agreed she would schedule an appt within the next 30 days with PCP.

## 2017-11-23 NOTE — Telephone Encounter (Signed)
Please call patient and let her know: I love [employer] but I cuss a little.

## 2017-11-24 NOTE — Telephone Encounter (Signed)
Attempted to contact Pt, no answer.  

## 2017-11-25 ENCOUNTER — Emergency Department (INDEPENDENT_AMBULATORY_CARE_PROVIDER_SITE_OTHER)
Admission: EM | Admit: 2017-11-25 | Discharge: 2017-11-25 | Disposition: A | Discharge status: Home / Self Care | Payer: Self-pay | Source: Home / Self Care | Attending: Family Medicine | Admitting: Family Medicine

## 2017-11-25 ENCOUNTER — Other Ambulatory Visit: Payer: Self-pay

## 2017-11-25 ENCOUNTER — Emergency Department (INDEPENDENT_AMBULATORY_CARE_PROVIDER_SITE_OTHER): Payer: Self-pay

## 2017-11-25 ENCOUNTER — Encounter: Payer: Self-pay | Admitting: Emergency Medicine

## 2017-11-25 DIAGNOSIS — R079 Chest pain, unspecified: Secondary | ICD-10-CM

## 2017-11-25 DIAGNOSIS — R739 Hyperglycemia, unspecified: Secondary | ICD-10-CM

## 2017-11-25 DIAGNOSIS — R0789 Other chest pain: Secondary | ICD-10-CM

## 2017-11-25 LAB — POCT FASTING CBG KUC MANUAL ENTRY: POCT Glucose (KUC): 244 mg/dL — AB (ref 70–99)

## 2017-11-25 NOTE — Telephone Encounter (Signed)
Called pt - LMOVM advising of provider's notation and to call to schedule appt.

## 2017-11-25 NOTE — ED Provider Notes (Signed)
[facility]    CSN: [id number] Arrival date & time: 11/25/17  1704     History   Chief Complaint Chief Complaint  Patient presents with  . Chest Pain    HPI Denise Tyler is a 52 y.o. female.   HPI  Denise Tyler is a 52 y.o. female presenting to UC with c/o chest pain/burning in center and Left of chest that has been constant "about 90% of the time" since onset about 1 week ago.  Pain has been intermittent into her Left upper arm.  Pain is mild to moderate in severity. Nothing seems to make it better or worse. It does not feel like her typical indigestion.  She does have a hx of DM Type 2 and has been off her insulin for a few weeks.  She called her PCP 4 days ago to have it refilled and was advised she was overdue for a f/u visit.  PCP agreed to refill her insulin for 30 days. Pt has been too busy with work to pick it up so it is still at the pharmacy.  Pt does not check her CBG frequently but last checked it 4 days ago, it was 269.  Her initial HgbA1c when dx with DM was about 12.  Last September when it was rechecked after pt had lost some weight, it was down to 11.1.   Pt does not have known hx of CAD but notes her mother had her first heart attack at 11 and died when she was 40.  This is concerning for pt as she is 51.     Past Medical History:  Diagnosis Date  . Diabetes mellitus without complication ([place])   . Mini stroke [facility])     Patient Active Problem List   Diagnosis Date Noted  . Hyperlipidemia associated with type 2 diabetes mellitus ([place]) 07/13/2017  . Primary osteoarthritis of right hip 07/13/2017  . Right hip pain 07/11/2017  . Hypertriglyceridemia 01/01/2017  . Anxiety state 12/25/2016  . Insomnia 12/25/2016  . Multiple joint pain 12/25/2016  . Black tarry stools 03/30/2016  . CVA tenderness 03/30/2016  . Type 2 diabetes mellitus with hyperglycemia, with long-term current use of insulin ([place]) 01/27/2016  . Onychomycosis 01/27/2016  . Hepatic  steatosis 11/27/2014  . Adenoma of left adrenal gland 11/27/2014  . Constipation 11/27/2014  . GERD (gastroesophageal reflux disease) 11/08/2013  . Epigastric pain 11/08/2013  . Diabetes mellitus type II, controlled ([place]) 11/08/2013  . ANKLE PAIN 02/02/2011    Past Surgical History:  Procedure Laterality Date  . ABDOMINAL HYSTERECTOMY    . BLADDER SURGERY    . CHOLECYSTECTOMY      OB History    No data available       Home Medications    Prior to Admission medications   Medication Sig Start Date End Date Taking? Authorizing Provider  atorvastatin (LIPITOR) 40 MG tablet Take 1 tablet (40 mg total) by mouth daily. 07/07/17   Breeback, Jade L, PA-C  glipiZIDE (GLUCOTROL) 10 MG tablet Take 1 tablet (10 mg total) by mouth 2 (two) times daily before a meal. 07/07/17   Breeback, Jade L, PA-C  Insulin Glargine (TOUJEO SOLOSTAR) 300 UNIT/ML SOPN Inject 10 Units into the skin at bedtime. Increase by 2 units every 5 days until fasting glucose is 100-120. Due for follow up. 11/23/17   Iran Planas L, PA-C  lisinopril (PRINIVIL,ZESTRIL) 5 MG tablet Take 1 tablet (5 mg total) by mouth daily. 07/07/17   Iran Planas  L, PA-C  LORazepam (ATIVAN) 0.5 MG tablet Take 1 tablet (0.5 mg total) by mouth every 8 (eight) hours as needed for anxiety. 07/07/17   Breeback, Royetta Car, PA-C  omeprazole (PRILOSEC) 40 MG capsule Take 1 capsule (40 mg total) by mouth daily. 07/07/17   Breeback, Royetta Car, PA-C  traZODone (DESYREL) 50 MG tablet Take 0.5-1 tablets (25-50 mg total) by mouth at bedtime as needed for sleep. 07/07/17   Donella Stade, PA-C    Family History Family History  Problem Relation Age of Onset  . Heart attack Mother   . Hypertension Mother   . Cancer Other   . Stroke Other   . Cancer Maternal Aunt   . Stroke Maternal Uncle   . Stroke Maternal Grandfather     Social History Social History   Tobacco Use  . Smoking status: Current Every Day Smoker    Packs/day: 1.00  . Smokeless tobacco:  Never Used  Substance Use Topics  . Alcohol use: Yes  . Drug use: No     Allergies   Metformin and related   Review of Systems Review of Systems  Constitutional: Negative for chills, diaphoresis, fatigue and fever.  HENT: Negative for congestion, ear pain, sore throat, trouble swallowing and voice change.   Respiratory: Negative for cough and shortness of breath.   Cardiovascular: Positive for chest pain. Negative for palpitations and leg swelling.  Gastrointestinal: Positive for abdominal pain (epigastric). Negative for diarrhea, nausea and vomiting.  Musculoskeletal: Positive for myalgias (Left upper arm). Negative for arthralgias and back pain.  Skin: Negative for rash.  Neurological: Negative for dizziness, light-headedness and headaches.     Physical Exam Triage Vital Signs ED Triage Vitals  Enc Vitals Group     BP 11/25/17 1737 (!) 144/84     Pulse Rate 11/25/17 1737 82     Resp 11/25/17 1737 18     Temp 11/25/17 1737 97.9 F (36.6 C)     Temp Source 11/25/17 1737 Oral     SpO2 11/25/17 1737 97 %     Weight 11/25/17 1738 225 lb (102.1 kg)     Height 11/25/17 1738 5\' 7"  (1.702 m)     Head Circumference --      Peak Flow --      Pain Score --      Pain Loc --      Pain Edu? --      Excl. in [place]? --    No data found.  Updated Vital Signs BP (!) 144/84 (BP Location: Right Arm)   Pulse 82   Temp 97.9 F (36.6 C) (Oral)   Resp 18   Ht 5\' 7"  (1.702 m)   Wt 225 lb (102.1 kg)   SpO2 97%   BMI 35.24 kg/m   Visual Acuity Right Eye Distance:   Left Eye Distance:   Bilateral Distance:    Right Eye Near:   Left Eye Near:    Bilateral Near:     Physical Exam  Constitutional: She is oriented to person, place, and time. She appears well-developed and well-nourished.  Non-toxic appearance. She does not appear ill. No distress.  Pt sitting on exam bed, NAD. Non-toxic appearing.  HENT:  Head: Normocephalic and atraumatic.  Eyes: EOM are normal.  Neck: Normal  range of motion. Neck supple.  Cardiovascular: Normal rate, regular rhythm and normal pulses.  No murmur heard. Pulmonary/Chest: Effort normal and breath sounds normal. No accessory muscle usage or stridor. No tachypnea. No  respiratory distress. She has no decreased breath sounds. She has no wheezes. She has no rhonchi. She has no rales. She exhibits no tenderness.  Musculoskeletal: Normal range of motion.  Neurological: She is alert and oriented to person, place, and time.  Skin: Skin is warm and dry.  Psychiatric: She has a normal mood and affect. Her behavior is normal.  Nursing note and vitals reviewed.    UC Treatments / Results  Labs (all labs ordered are listed, but only abnormal results are displayed) Labs Reviewed  POCT FASTING CBG [place] - Abnormal; Notable for the following components:      Result Value   POCT Glucose ([facility]) 244 (*)    All other components within normal limits    EKG  EKG Interpretation Date/Time:11/25/2017   17:26:52 Ventricular Rate: 74 PR Interval: 184 QRS Duration: 88 QT Interval: 412 QTC Calculation: 457 P-R-T axes: 45  23   73 Text Interpretation: Sinus rhythm with occasional premature ventricular complexes. Otherwise normal ECG No prior EKG to compare         Radiology Dg Chest 2 View  Result Date: 11/25/2017 CLINICAL DATA:  Left side chest pain EXAM: CHEST  2 VIEW COMPARISON:  None. FINDINGS: Heart and mediastinal contours are within normal limits. No focal opacities or effusions. No acute bony abnormality. IMPRESSION: No active cardiopulmonary disease. Electronically Signed   By: Rolm Baptise M.D.   On: 11/25/2017 18:14    Procedures Procedures (including critical care time)  Medications Ordered in UC Medications - No data to display   Initial Impression / Assessment and Plan / UC Course  I have reviewed the triage vital signs and the nursing notes.  Pertinent labs & imaging results that were available during  my care of the patient were reviewed by me and considered in my medical decision making (see chart for details).     Reassured pt of normal EKG CBG better than pt's CBG 4 days ago, however, still concerning her DM is not being treated at this time. Strongly encouraged to pick up her insulin this evening Strongly encouraged f/u with PCP next week to get "back on track" treating her DM Due to [place] of CAD, pt may benefit from cardiology referral. Pt reports having a stress test about 10 years ago but none since then. Also encouraged to start taking omeprazole again to see if this helps with chest discomfort as pt has not taken her omeprazole for "a long time" pt believes about 1 year.  Offered to send in prescription, pt states she will take OTC version  Discussed symptoms that warrant emergent care in the ED.   Final Clinical Impressions(s) / UC Diagnoses   Final diagnoses:  Nonspecific chest pain  Hyperglycemia    ED Discharge Orders    None       Controlled Substance Prescriptions [place] Controlled Substance Registry consulted? Not Applicable   Tyrell Antonio 11/25/17 4782

## 2017-11-25 NOTE — ED Triage Notes (Signed)
Patient reports having chest pain/burning in left side of chest which lasted a while; then intermittently over the next days. It does not feel like her normal indigestion and her entire left arm hurts. No dyspnea.

## 2017-11-28 ENCOUNTER — Telehealth: Payer: Self-pay | Admitting: *Deleted

## 2017-11-28 NOTE — Telephone Encounter (Signed)
Callback: No answer, LMOM f/u from visit. Call back as needed.

## 2018-01-06 ENCOUNTER — Ambulatory Visit (INDEPENDENT_AMBULATORY_CARE_PROVIDER_SITE_OTHER): Payer: Self-pay | Admitting: Family Medicine

## 2018-01-06 ENCOUNTER — Encounter: Payer: Self-pay | Admitting: Family Medicine

## 2018-01-06 VITALS — BP 140/80 | Ht 67.0 in | Wt 228.0 lb

## 2018-01-06 DIAGNOSIS — Z794 Long term (current) use of insulin: Secondary | ICD-10-CM

## 2018-01-06 DIAGNOSIS — M7061 Trochanteric bursitis, right hip: Secondary | ICD-10-CM

## 2018-01-06 DIAGNOSIS — M25561 Pain in right knee: Secondary | ICD-10-CM

## 2018-01-06 DIAGNOSIS — E1165 Type 2 diabetes mellitus with hyperglycemia: Secondary | ICD-10-CM

## 2018-01-06 MED ORDER — DICLOFENAC SODIUM 1 % TD GEL: 4.0000 g | Freq: Four times a day (QID) | TRANSDERMAL | 11 refills | Status: DC

## 2018-01-06 MED ORDER — BASAGLAR KWIKPEN 100 UNIT/ML ~~LOC~~ SOPN: 15.0000 [IU] | PEN_INJECTOR | Freq: Every day | SUBCUTANEOUS | 1 refills | Status: DC

## 2018-01-06 MED ORDER — LISINOPRIL 5 MG PO TABS: 5.0000 mg | ORAL_TABLET | Freq: Every day | ORAL | 0 refills | Status: DC

## 2018-01-06 MED ORDER — AMBULATORY NON FORMULARY MEDICATION: 0 refills | Status: DC

## 2018-01-06 NOTE — Progress Notes (Signed)
Denise Tyler is a 52 y.o. female who presents to [facility] today for right hip and knee pain and diabetes.   Denise Tyler has an ongoing history of pain in the right lateral hip and right knee. The lateral hip pain has been present for months without injury. The pain is worse with ambulation and with laying on the right side. She notes that the pain does radiate down the lateral leg to the knee. She is taking Aleve which does not help much. She had xray hip and femur in September which showed mild hip DJD only.   Denise Tyler also notes pain in the right knee. This pain is present for about 1 month and occurs without injury. She notes that the pain is worse with climbing stairs and with sitting in a flexed knee position. She had tried aleve as above.   Diabetes: Denise Tyler ran out of insulin a few months ago. She does not check her blood sugar regularly. She does not have insurance and has trouble affording her insulin. She also has trouble paying for lab and doctor visits. She has not investigated [facility] or manufacturer assistance for her insulin. She denies polyuria or polydipsia.   Past Medical History:  Diagnosis Date  . Diabetes mellitus without complication (Denise Tyler)   . Mini stroke Denise Tyler)    Past Surgical History:  Procedure Laterality Date  . ABDOMINAL HYSTERECTOMY    . BLADDER SURGERY    . CHOLECYSTECTOMY     Social History   Tobacco Use  . Smoking status: Current Every Day Smoker    Packs/day: 1.00  . Smokeless tobacco: Never Used  Substance Use Topics  . Alcohol use: Yes   family history includes Cancer in her maternal aunt and other; Heart attack in her mother; Hypertension in her mother; Stroke in her maternal grandfather, maternal uncle, and other.  ROS as above:  Medications: Current Outpatient Medications  Medication Sig Dispense Refill  . atorvastatin  (LIPITOR) 40 MG tablet Take 1 tablet (40 mg total) by mouth daily. 90 tablet 3  . glipiZIDE (GLUCOTROL) 10 MG tablet Take 1 tablet (10 mg total) by mouth 2 (two) times daily before a meal. 60 tablet 2  . lisinopril (PRINIVIL,ZESTRIL) 5 MG tablet Take 1 tablet (5 mg total) by mouth daily. 90 tablet 0  . LORazepam (ATIVAN) 0.5 MG tablet Take 1 tablet (0.5 mg total) by mouth every 8 (eight) hours as needed for anxiety. 20 tablet 1  . omeprazole (PRILOSEC) 40 MG capsule Take 1 capsule (40 mg total) by mouth daily. 30 capsule 3  . traZODone (DESYREL) 50 MG tablet Take 0.5-1 tablets (25-50 mg total) by mouth at bedtime as needed for sleep. 30 tablet 3  . AMBULATORY NON FORMULARY MEDICATION Single glucometer 1 each 0  . diclofenac sodium (VOLTAREN) 1 % GEL Apply 4 g topically 4 (four) times daily. To affected joint. 100 g 11  . Insulin Glargine (BASAGLAR KWIKPEN) 100 UNIT/ML SOPN Inject 0.15 mLs (15 Units total) into the skin at bedtime. 5 pen 1   No current facility-administered medications for this visit.    Allergies  Allergen Reactions  . Metformin And Related     diarrhea    Health Maintenance Health Maintenance  Topic Date Due  . PNEUMOCOCCAL POLYSACCHARIDE VACCINE (1) 06/18/1968  . OPHTHALMOLOGY EXAM  06/18/1976  . HIV Screening  06/18/1981  . TETANUS/TDAP  06/18/1985  . PAP SMEAR  10/26/2010  .  MAMMOGRAM  06/18/2016  . COLONOSCOPY  06/18/2016  . FOOT EXAM  01/26/2017  . HEMOGLOBIN A1C  01/04/2018  . INFLUENZA VACCINE  07/06/2018 (Originally 05/26/2017)     Exam:  BP 140/80   Ht 5\' 7"  (1.702 m)   Wt 228 lb (103.4 kg)   BMI 35.71 kg/m  Gen: Well NAD HEENT: EOMI,  MMM Lungs: Normal work of breathing. CTABL Heart: RRR no MRG Abd: NABS, Soft. Nondistended, Nontender Exts: Brisk capillary refill, warm and well perfused.   MSK: Right hip normal-appearing. Range of motion intact. No significant pain in the groin with rotational range of motion. Significantly tender to  palpation of the greater trochanter. Significantly weak to resisted hip abduction 3/5. This test reproduces her pain in her lateral hip.  Right knee: No effusion Range of motion 0-120 with 2+ retropatellar crepitations Tender to palpation medial joint line Stable ligamentous exam  EXAM: DG HIP (WITH OR WITHOUT PELVIS) 2V RIGHT  COMPARISON:  None.  FINDINGS: The pelvic ring is intact. Mild degenerative changes of the pubic symphysis are seen. No acute fracture or dislocation is noted. Mild degenerative change of the hip joints and lumbar spine are seen. No soft tissue abnormality is noted.  IMPRESSION: No acute abnormality seen.  Mild degenerative change noted.   Electronically Signed   By: Inez Catalina M.D.   On: 07/07/2017 13:33 EXAM: RIGHT FEMUR 1 VIEW  COMPARISON:  None.  FINDINGS: No acute fracture or dislocation is noted. No gross soft tissue abnormality is seen.  IMPRESSION: No acute abnormality noted.   Electronically Signed   By: Inez Catalina M.D. I personally (independently) visualized and performed the interpretation of the images attached in this note.  Lab Results  Component Value Date   HGBA1C 11.1 07/07/2017     Chemistry      Component Value Date/Time   NA 136 07/07/2017 1124   K 3.9 07/07/2017 1124   CL 102 07/07/2017 1124   CO2 25 07/07/2017 1124   BUN 9 07/07/2017 1124   CREATININE 0.70 07/07/2017 1124      Component Value Date/Time   CALCIUM 9.2 07/07/2017 1124   ALKPHOS 90 12/31/2016 1641   AST 19 07/07/2017 1124   ALT 34 (H) 07/07/2017 1124   BILITOT 0.5 07/07/2017 1124       Assessment and Plan: 52 y.o. female with  Right Lateral Hip Pain is trochanteric bursitis or hip abductor tendinitis. Plan for trial home exercise plan of stretching and strength exercises. If not better will refer to [facility].   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  Plan for trial of Voltaren  gel. If not better next step is steroid injection.   Diabetes: Poorly controlled. I think the main reason for poor control is social. Denise Tyler does not have health insurance and cannot afford doctor visits in her medication easily. We discussed researching drug cost with good Rx. I provided 2 sample pens of [place], and prescribed Basaglar which is probably the cheapest basal insulin. Plan to start at 15 units and titrate 3 units every 3 day if blood sugar greater than 200. Additionally provided a glucometer as hers is old and does not work and accurate blood sugar measurement is essential for insulin titration.  I also recommend that [place] prescribed for [place] manufacturer assistance. Follow up with PCP in 1 month.  Lisinopril refilled for kidney protection.    No orders of the defined  types were placed in this encounter.  Meds ordered this encounter  Medications  . Insulin Glargine (BASAGLAR KWIKPEN) 100 UNIT/ML SOPN    Sig: Inject 0.15 mLs (15 Units total) into the skin at bedtime.    Dispense:  5 pen    Refill:  1  . diclofenac sodium (VOLTAREN) 1 % GEL    Sig: Apply 4 g topically 4 (four) times daily. To affected joint.    Dispense:  100 g    Refill:  11  . AMBULATORY NON FORMULARY MEDICATION    Sig: Single glucometer    Dispense:  1 each    Refill:  0  . lisinopril (PRINIVIL,ZESTRIL) 5 MG tablet    Sig: Take 1 tablet (5 mg total) by mouth daily.    Dispense:  90 tablet    Refill:  0     Discussed warning signs or symptoms. Please see discharge instructions. Patient expresses understanding.

## 2018-01-06 NOTE — Patient Instructions (Addendum)
Thank you for coming in today. Start insulin 15 units daily.  Increase by 3 units every 3 days if the morning fasting blood sugar is more than 200.  Check sugars daily.   Restart insulin and blood pressure medicine.  Recheck with Jade in 1 month.  Use the diclonfenc gel up to 4x daily.  Do the stretches and the side leg lifts  Try to get 30 reps  Max dose of aleve is 2 pills twice daily.  You can also use tylenol up to 1000mg  every 6 hours.   Trochanteric Bursitis Trochanteric bursitis is a condition that causes hip pain. Trochanteric bursitis happens when fluid-filled sacs (bursae) in the hip get irritated. Normally these sacs absorb shock and help strong bands of tissue (tendons) in your hip glide smoothly over each other and over your hip bones. What are the causes? This condition results from increased friction between the hip bones and the tendons that go over them. This condition can happen if you:  Have weak hips.  Use your hip muscles too much (overuse).  Get hit in the hip.  What increases the risk? This condition is more likely to develop in:  Women.  Adults who are middle-aged or older.  People with arthritis or a spinal condition.  People with weak buttocks muscles (gluteal muscles).  People who have one leg that is shorter than the other.  People who participate in certain kinds of athletic activities, such as: ? Running sports, especially long-distance running. ? Contact sports, like football or martial arts. ? Sports in which falls may occur, like skiing.  What are the signs or symptoms? The main symptom of this condition is pain and tenderness over the point of your hip. The pain may be:  Sharp and intense.  Dull and achy.  Felt on the outside of your thigh.  It may increase when you:  Lie on your side.  Walk or run.  Go up on stairs.  Sit.  Stand up after sitting.  Stand for long periods of time.  How is this diagnosed? This  condition may be diagnosed based on:  Your symptoms.  Your medical history.  A physical exam.  Imaging tests, such as: ? X-rays to check your bones. ? An MRI or ultrasound to check your tendons and muscles.  During your physical exam, your health care provider will check the movement and strength of your hip. He or she may press on the point of your hip to check for pain. How is this treated? This condition may be treated by:  Resting.  Reducing your activity.  Avoiding activities that cause pain.  Using crutches, a cane, or a walker to decrease the strain on your hip.  Taking medicine to help with swelling.  Having medicine injected into the bursae to help with swelling.  Using ice, heat, and massage therapy for pain relief.  Physical therapy exercises for strength and flexibility.  Surgery (rare).  Follow these instructions at home: Activity  Rest.  Avoid activities that cause pain.  Return to your normal activities as told by your health care provider. Ask your health care provider what activities are safe for you. Managing pain, stiffness, and swelling  Take over-the-counter and prescription medicines only as told by your health care provider.  If directed, apply heat to the injured area as told by your health care provider. ? Place a towel between your skin and the heat source. ? Leave the heat on for 20-30 minutes. ?  Remove the heat if your skin turns bright red. This is especially important if you are unable to feel pain, heat, or cold. You may have a greater risk of getting burned.  If directed, apply ice to the injured area: ? Put ice in a plastic bag. ? Place a towel between your skin and the bag. ? Leave the ice on for 20 minutes, 2-3 times a day. General instructions  If the affected leg is one that you use for driving, ask your health care provider when it is safe to drive.  Use crutches, a cane, or a walker as told by your health care  provider.  If one of your legs is shorter than the other, get fitted for a shoe insert.  Lose weight if you are overweight. How is this prevented?  Wear supportive footwear that is appropriate for your sport.  If you have hip pain, start any new exercise or sport slowly.  Maintain physical fitness, including: ? Strength. ? Flexibility. Contact a health care provider if:  Your pain does not improve with 2-4 weeks. Get help right away if:  You develop severe pain.  You have a fever.  You develop increased redness over your hip.  You have a change in your bowel function or bladder function.  You cannot control the muscles in your feet. This information is not intended to replace advice given to you by your health care provider. Make sure you discuss any questions you have with your health care provider. Document Released: 11/19/2004 Document Revised: 06/17/2016 Document Reviewed: 09/27/2015 Elsevier Interactive Patient Education  2018 [employer].    Trochanteric Bursitis Rehab Ask your health care provider which exercises are safe for you. Do exercises exactly as told by your health care provider and adjust them as directed. It is normal to feel mild stretching, pulling, tightness, or discomfort as you do these exercises, but you should stop right away if you feel sudden pain or your pain gets worse.Do not begin these exercises until told by your health care provider. Stretching exercises These exercises warm up your muscles and joints and improve the movement and flexibility of your hip. These exercises also help to relieve pain and stiffness. Exercise A: Iliotibial band stretch  1. Lie on your side with your left / right leg in the top position. 2. Bend your left / right knee and grab your ankle. 3. Slowly bring your knee back so your thigh is behind your body. 4. Slowly lower your knee toward the floor until you feel a gentle stretch on the outside of your left / right  thigh. If you do not feel a stretch and your knee will not fall farther, place the heel of your other foot on top of your outer knee and pull your thigh down farther. 5. Hold this position for __________ seconds. 6. Slowly return to the starting position. Repeat __________ times. Complete this exercise __________ times a day. Strengthening exercises These exercises build strength and endurance in your hip and pelvis. Endurance is the ability to use your muscles for a long time, even after they get tired. Exercise B: Bridge ( hip extensors) 1. Lie on your back on a firm surface with your knees bent and your feet flat on the floor. 2. Tighten your buttocks muscles and lift your buttocks off the floor until your trunk is level with your thighs. You should feel the muscles working in your buttocks and the back of your thighs. If this exercise is  too easy, try doing it with your arms crossed over your chest. 3. Hold this position for __________ seconds. 4. Slowly return to the starting position. 5. Let your muscles relax completely between repetitions. Repeat __________ times. Complete this exercise __________ times a day. Exercise C: Squats ( knee extensors and  quadriceps) 1. Stand in front of a table, with your feet and knees pointing straight ahead. You may rest your hands on the table for balance but not for support. 2. Slowly bend your knees and lower your hips like you are going to sit in a chair. ? Keep your weight over your heels, not over your toes. ? Keep your lower legs upright so they are parallel with the table legs. ? Do not let your hips go lower than your knees. ? Do not bend lower than told by your health care provider. ? If your hip pain increases, do not bend as low. 3. Hold this position for __________ seconds. 4. Slowly push with your legs to return to standing. Do not use your hands to pull yourself to standing. Repeat __________ times. Complete this exercise __________  times a day. Exercise D: Hip hike 1. Stand sideways on a bottom step. Stand on your left / right leg with your other foot unsupported next to the step. You can hold onto the railing or wall if needed for balance. 2. Keeping your knees straight and your torso square, lift your left / right hip up toward the ceiling. 3. Hold this position for __________ seconds. 4. Slowly let your left / right hip lower toward the floor, past the starting position. Your foot should get closer to the floor. Do not lean or bend your knees. Repeat __________ times. Complete this exercise __________ times a day. Exercise E: Single leg stand 1. Stand near a counter or door frame that you can hold onto for balance as needed. It is helpful to stand in front of a mirror for this exercise so you can watch your hip. 2. Squeeze your left / right buttock muscles then lift up your other foot. Do not let your left / right hip push out to the side. 3. Hold this position for __________ seconds. Repeat __________ times. Complete this exercise __________ times a day. This information is not intended to replace advice given to you by your health care provider. Make sure you discuss any questions you have with your health care provider. Document Released: 11/19/2004 Document Revised: 06/18/2016 Document Reviewed: 09/27/2015 Elsevier Interactive Patient Education  [employer].

## 2018-01-10 ENCOUNTER — Ambulatory Visit: Payer: Self-pay | Admitting: Physician Assistant

## 2018-01-25 ENCOUNTER — Ambulatory Visit (INDEPENDENT_AMBULATORY_CARE_PROVIDER_SITE_OTHER): Payer: Self-pay | Admitting: Physician Assistant

## 2018-01-25 ENCOUNTER — Ambulatory Visit (INDEPENDENT_AMBULATORY_CARE_PROVIDER_SITE_OTHER): Payer: Self-pay

## 2018-01-25 ENCOUNTER — Encounter: Payer: Self-pay | Admitting: Physician Assistant

## 2018-01-25 VITALS — BP 118/85 | HR 93 | Ht 67.0 in | Wt 225.0 lb

## 2018-01-25 DIAGNOSIS — G8929 Other chronic pain: Secondary | ICD-10-CM

## 2018-01-25 DIAGNOSIS — M25561 Pain in right knee: Secondary | ICD-10-CM

## 2018-01-25 DIAGNOSIS — M25562 Pain in left knee: Secondary | ICD-10-CM

## 2018-01-25 DIAGNOSIS — R5383 Other fatigue: Secondary | ICD-10-CM

## 2018-01-25 DIAGNOSIS — Z794 Long term (current) use of insulin: Secondary | ICD-10-CM

## 2018-01-25 DIAGNOSIS — E1165 Type 2 diabetes mellitus with hyperglycemia: Secondary | ICD-10-CM

## 2018-01-25 DIAGNOSIS — M1611 Unilateral primary osteoarthritis, right hip: Secondary | ICD-10-CM

## 2018-01-25 DIAGNOSIS — F411 Generalized anxiety disorder: Secondary | ICD-10-CM

## 2018-01-25 DIAGNOSIS — Z6835 Body mass index (BMI) 35.0-35.9, adult: Secondary | ICD-10-CM

## 2018-01-25 MED ORDER — MELOXICAM 15 MG PO TABS: 15.0000 mg | ORAL_TABLET | Freq: Every day | ORAL | 2 refills | Status: DC

## 2018-01-25 MED ORDER — LORAZEPAM 0.5 MG PO TABS: 0.5000 mg | ORAL_TABLET | Freq: Three times a day (TID) | ORAL | 2 refills | Status: DC | PRN

## 2018-01-25 MED ORDER — ESCITALOPRAM OXALATE 10 MG PO TABS: 10.0000 mg | ORAL_TABLET | Freq: Every day | ORAL | 2 refills | Status: DC

## 2018-01-25 NOTE — Patient Instructions (Signed)

## 2018-01-26 NOTE — Progress Notes (Signed)
Call pt: no significant arthritis or abnormality of either knee. I do think this is your right hip and likely the arthritis. Next step is MRI of right hip or ortho referral. I do think it is bad enough for ortho to intervene.

## 2018-01-28 NOTE — Progress Notes (Signed)
Call pt: b12 looks good.  Thyroid is normal.  Glucose elevated but kidney and liver look great.  You need to go to ortho to get your right hip looked at.

## 2018-01-30 ENCOUNTER — Other Ambulatory Visit: Payer: Self-pay | Admitting: Physician Assistant

## 2018-01-30 ENCOUNTER — Encounter: Payer: Self-pay | Admitting: Physician Assistant

## 2018-01-30 DIAGNOSIS — R5383 Other fatigue: Secondary | ICD-10-CM | POA: Insufficient documentation

## 2018-01-30 DIAGNOSIS — M25561 Pain in right knee: Secondary | ICD-10-CM

## 2018-01-30 DIAGNOSIS — G4700 Insomnia, unspecified: Secondary | ICD-10-CM

## 2018-01-30 DIAGNOSIS — M25562 Pain in left knee: Secondary | ICD-10-CM

## 2018-01-30 DIAGNOSIS — G8929 Other chronic pain: Secondary | ICD-10-CM | POA: Insufficient documentation

## 2018-01-30 LAB — COMPLETE METABOLIC PANEL WITH GFR
AG Ratio: 1.7 (calc) (ref 1.0–2.5)
ALT: 18 U/L (ref 6–29)
AST: 12 U/L (ref 10–35)
Albumin: 4.3 g/dL (ref 3.6–5.1)
Alkaline phosphatase (APISO): 97 U/L (ref 33–130)
BUN: 14 mg/dL (ref 7–25)
CALCIUM: 9.2 mg/dL (ref 8.6–10.4)
CO2: 25 mmol/L (ref 20–32)
CREATININE: 0.72 mg/dL (ref 0.50–1.05)
Chloride: 105 mmol/L (ref 98–110)
GFR, EST AFRICAN AMERICAN: 112 mL/min/{1.73_m2} (ref >=60)
GFR, EST NON AFRICAN AMERICAN: 97 mL/min/{1.73_m2} (ref >=60)
GLOBULIN: 2.6 g/dL (ref 1.9–3.7)
Glucose, Bld: 246 mg/dL — ABNORMAL HIGH (ref 65–99)
Potassium: 4.3 mmol/L (ref 3.5–5.3)
SODIUM: 137 mmol/L (ref 135–146)
TOTAL PROTEIN: 6.9 g/dL (ref 6.1–8.1)
Total Bilirubin: 0.6 mg/dL (ref 0.2–1.2)

## 2018-01-30 LAB — CBC
HCT: 40.9 % (ref 35.0–45.0)
HEMOGLOBIN: 14.3 g/dL (ref 11.7–15.5)
MCH: 28.1 pg (ref 27.0–33.0)
MCHC: 35 g/dL (ref 32.0–36.0)
MCV: 80.5 fL (ref 80.0–100.0)
MPV: 11.1 fL (ref 7.5–12.5)
Platelets: 234 10*3/uL (ref 140–400)
RBC: 5.08 10*6/uL (ref 3.80–5.10)
RDW: 12.9 % (ref 11.0–15.0)
WBC: 7.7 10*3/uL (ref 3.8–10.8)

## 2018-01-30 LAB — TSH: TSH: 2.16 mIU/L

## 2018-01-30 LAB — B12 AND FOLATE PANEL
Folate: 13.6 ng/mL
VITAMIN B 12: 458 pg/mL (ref 200–1100)

## 2018-01-30 LAB — FERRITIN: Ferritin: 173 ng/mL (ref 10–232)

## 2018-01-30 LAB — VITAMIN D 1,25 DIHYDROXY
VITAMIN D 1, 25 (OH) TOTAL: 40 pg/mL (ref 18–72)
Vitamin D2 1, 25 (OH)2: <8 pg/mL
Vitamin D3 1, 25 (OH)2: 40 pg/mL

## 2018-01-30 NOTE — Progress Notes (Signed)
Referral made 

## 2018-01-30 NOTE — Progress Notes (Signed)
Subjective:    Patient ID: Denise Tyler, female    DOB: 1966-04-18, 52 y.o.   MRN: [id number]  HPI Pt is a 52 yo obesefemale who presents to the clinic for follow up on right leg pain, right hip pain and right knee pain. She was seen by Dr. Georgina Snell about 1 month ago who suspected pain was coming from hip in the form of hip abducter tendonitis vs hip bursitis. Her pain has worsened since then.she can almost not walk at times. She is becoming desperate but does not want opioids. She had an accident about a week ago where she tripped and landed on her right knee. She wonders if she did anyting to her right knee.   She feels tired all the time. She sleep pretty well. Work is stressful and does not think she is depressed but she is very anxious.she is not finding much pleasure in doing the things she used too. She is very tired of the persistent pain she is in as well. She does use the ativan as needed. No SI/HC.    [name] Kitchen. Active Ambulatory Problems    Diagnosis Date Noted  . ANKLE PAIN 02/02/2011  . GERD (gastroesophageal reflux disease) 11/08/2013  . Epigastric pain 11/08/2013  . Uncontrolled diabetes mellitus ([place]) 11/08/2013  . Hepatic steatosis 11/27/2014  . Adenoma of left adrenal gland 11/27/2014  . Constipation 11/27/2014  . Type 2 diabetes mellitus with hyperglycemia, with long-term current use of insulin ([place]) 01/27/2016  . Onychomycosis 01/27/2016  . Black tarry stools 03/30/2016  . CVA tenderness 03/30/2016  . Anxiety state 12/25/2016  . Insomnia 12/25/2016  . Multiple joint pain 12/25/2016  . Hypertriglyceridemia 01/01/2017  . Right hip pain 07/11/2017  . Hyperlipidemia associated with type 2 diabetes mellitus ([place]) 07/13/2017  . Primary osteoarthritis of right hip 07/13/2017  . Trochanteric bursitis of right hip 01/06/2018  . Right knee pain 01/06/2018  . No energy 01/30/2018  . Chronic pain of both knees 01/30/2018   Resolved Ambulatory Problems    Diagnosis Date Noted  .  No Resolved Ambulatory Problems   Past Medical History:  Diagnosis Date  . Diabetes mellitus without complication ([place])   . Mini stroke [facility])       Review of Systems  All other systems reviewed and are negative.      Objective:   Physical Exam  Constitutional: She is oriented to person, place, and time. She appears well-developed and well-nourished.  HENT:  Head: Normocephalic and atraumatic.  Cardiovascular: Normal rate, regular rhythm and normal heart sounds.  Pulmonary/Chest: Effort normal and breath sounds normal.  Musculoskeletal:  Difficult time getting up and standing.  Tenderness over medial knee in the joint space and supramedial.  Tenderness with Mc[place] but no click.  Anterior drawer intact.  Strength of right leg is decreased due to pain.   Neurological: She is alert and oriented to person, place, and time.  Psychiatric: She has a normal mood and affect. Her behavior is normal.          Assessment & Plan:  [name] KitchenMarland KitchenDiagnoses and all orders for this visit:  No energy -     B12 and Folate Panel -     COMPLETE METABOLIC PANEL WITH GFR -     TSH -     CBC -     Vitamin D 1,25 dihydroxy -     Ferritin  Anxiety state -     LORazepam (ATIVAN) 0.5 MG tablet; Take 1 tablet (0.5 mg  total) by mouth every 8 (eight) hours as needed for anxiety. -     B12 and Folate Panel -     COMPLETE METABOLIC PANEL WITH GFR -     TSH -     CBC -     Vitamin D 1,25 dihydroxy -     Ferritin -     escitalopram (LEXAPRO) 10 MG tablet; Take 1 tablet (10 mg total) by mouth daily.  Primary osteoarthritis of right hip -     meloxicam (MOBIC) 15 MG tablet; Take 1 tablet (15 mg total) by mouth daily.  Chronic pain of both knees -     meloxicam (MOBIC) 15 MG tablet; Take 1 tablet (15 mg total) by mouth daily. -     DG Knee Complete 4 Views Left -     DG Knee Complete 4 Views Right  Type 2 diabetes mellitus with hyperglycemia, with long-term current use of insulin ([place])   .[name] Kitchen GAD  7 : Generalized Anxiety Score 01/30/2018 12/25/2016  Nervous, Anxious, on Edge 2 3  Control/stop worrying 2 3  Worry too much - different things 2 3  Trouble relaxing 2 3  Restless 2 3  Easily annoyed or irritable 2 3  Afraid - awful might happen 2 3  Total GAD 7 Score 14 21  Anxiety Difficulty Somewhat difficult -    Pt does not have insurance and is a barrier to care. I will get xray of knees to look at OA status and if they are a source to the increased pain. Certainly she could have sprained knee during fall but I suspect pain is coming from hip. I feel like symptoms are consisent with hip OA. Given mobic. Discussed weight loss.   For energy anxiety could be causes a role. Will start lexapro. Discussed side effects. Labs ordered. Ativan as needed. Discussed abuse potential only use as needed.   DM- continue on basaglar. Fasting sugars seem to be decreasing. Continue to increase by 3 units every 5 days until fasting 100-120 or gets to 40 units. Diabetic diet discussed. Follow up in 2 months for next A!C.

## 2018-01-31 NOTE — Progress Notes (Signed)
Referral made 

## 2018-02-02 ENCOUNTER — Ambulatory Visit: Payer: Self-pay | Admitting: Physician Assistant

## 2018-02-07 ENCOUNTER — Encounter: Payer: Self-pay | Admitting: Physician Assistant

## 2018-02-07 ENCOUNTER — Telehealth: Payer: Self-pay | Admitting: Physician Assistant

## 2018-02-07 NOTE — Telephone Encounter (Signed)
Pt called office today stating she was seen for her knee/hip pain. Pt states she has an appt with ortho on 02/14/18, but she was unable to work today due to pain. Questions if PCP would write her a work note for today and tomorrow. Will route.

## 2018-02-07 NOTE — Telephone Encounter (Signed)
Ok for note 

## 2018-02-07 NOTE — Telephone Encounter (Signed)
Letter printed. Pt advised.

## 2018-02-08 ENCOUNTER — Ambulatory Visit (INDEPENDENT_AMBULATORY_CARE_PROVIDER_SITE_OTHER): Payer: Self-pay | Admitting: Orthopaedic Surgery

## 2018-02-08 ENCOUNTER — Encounter (INDEPENDENT_AMBULATORY_CARE_PROVIDER_SITE_OTHER): Payer: Self-pay | Admitting: Orthopaedic Surgery

## 2018-02-08 ENCOUNTER — Other Ambulatory Visit (INDEPENDENT_AMBULATORY_CARE_PROVIDER_SITE_OTHER): Payer: Self-pay | Admitting: Radiology

## 2018-02-08 VITALS — BP 128/78 | HR 99 | Resp 18 | Ht 67.0 in | Wt 220.0 lb

## 2018-02-08 DIAGNOSIS — M25561 Pain in right knee: Principal | ICD-10-CM

## 2018-02-08 DIAGNOSIS — G8929 Other chronic pain: Secondary | ICD-10-CM

## 2018-02-08 NOTE — Progress Notes (Signed)
Office Visit Note   Patient: Denise Tyler           Date of Birth: Jan 29, 1966           MRN: [id number] Visit Date: 02/08/2018              Requested by: Donella Stade, PA-C [employer] [place], [place] [address] PCP: Donella Stade, PA-C   Assessment & Plan: Visit Diagnoses:  1. Chronic pain of right knee     Plan: Present right leg pain appears to be localized about the right knee.  Will order MRI scan of the knee and have her return.  Having some numbness and tingling in both of the lower extremities particularly in her feet which might be related to diabetic neuropathy.  She can check with her primary care physician regarding treatment.  Has had prior films of her pelvis and hips and knees without significant pathology.  Might consider EMGs and nerve conduction studies as she is having some weakness.  No related back pain.  Has no insurance so I am trying to limit expenses.  Not sure I can specifically diagnosed the etiology of her pain.  Could be a combination of factors.  Discussed all of the above with her.  We will start with an MRI of her knee  Follow-Up Instructions: Return after MRI right knee.   Orders:  No orders of the defined types were placed in this encounter.  No orders of the defined types were placed in this encounter.     Procedures: No procedures performed   Clinical Data: No additional findings.   Subjective: Chief Complaint  Patient presents with  . Right Leg - Pain  . Left Knee - Pain    Stepped off curb heard a pop, had swelling off and on around christmas  . New Patient (Initial Visit)    r leg pain 10 months, no injury or injections numbness and weakness, right hip pain too. starting to have left leg pain  [name] visit to the office today for evaluation of lower extremity pain.  She thinks she is been having some trouble with her legs for approximately a year.  Pain seems to rotate between her right and her left  side.  She feels like she has had some "weakness" particularly in the area of her right thigh.  She has had some left knee pain and right knee pain that will come and go" over a period of months.  Several weeks ago she had an injury to her right knee while trying to go up a ladder.  Since that time she feels like she has had more pain and more weakness on the right side. Has been diagnosed with diabetes.  She has lost over 40 pounds with the present BMI of about 35.  SHe is on insulin.  She also smokes I reviewed films of her pelvis and specifically right hip and both knees on the PACS system.  I did not see any specific abnormalities nephric and osteoarthritis.  HPI  Review of Systems  Constitutional: Positive for fatigue.  HENT: Negative for ear pain.   Eyes: Negative for pain.  Respiratory: Negative for cough and shortness of breath.   Cardiovascular: Positive for leg swelling.  Gastrointestinal: Positive for constipation and diarrhea.  Genitourinary: Negative for difficulty urinating.  Musculoskeletal: Negative for back pain and neck pain.  Skin: Negative for rash.  Allergic/Immunologic: Negative for food allergies.  Neurological: Positive  for weakness and numbness.  Hematological: Does not bruise/bleed easily.  Psychiatric/Behavioral: Positive for sleep disturbance.     Objective: Vital Signs: BP 128/78 (BP Location: Right Arm, Patient Position: Sitting, Cuff Size: Normal)   Pulse 99   Resp 18   Ht 5\' 7"  (1.702 m)   Wt 220 lb (99.8 kg)   BMI 34.46 kg/m   Physical Exam  Ortho Exam awake alert and oriented x3.  Comfortable sitting.  Walks without a limp.  No percussible back pain.  No localized tenderness over either hip.  No significant pain with internal and external rotation of either hip.  No obvious atrophy or hypertrophy of long leg compared to the other.  Has pain in multiple locations about her right knee with.  No effusion.  No instability.  Full extension flexion over  100 degrees.  No opening with varus valgus stress.  Pain in the popliteal space right knee but without mass.  No calf pain.  No distal edema.  Good pulses in both of her feet.  Does have some decreased sensibility and burning in both of her feet.  Left knee exam similar to right.  Leg raise negative.  Specialty Comments:  No specialty comments available.  Imaging: No results found.   PMFS History: Patient Active Problem List   Diagnosis Date Noted  . No energy 01/30/2018  . Chronic pain of both knees 01/30/2018  . Trochanteric bursitis of right hip 01/06/2018  . Right knee pain 01/06/2018  . Hyperlipidemia associated with type 2 diabetes mellitus ([place]) 07/13/2017  . Primary osteoarthritis of right hip 07/13/2017  . Right hip pain 07/11/2017  . Hypertriglyceridemia 01/01/2017  . Anxiety state 12/25/2016  . Insomnia 12/25/2016  . Multiple joint pain 12/25/2016  . Black tarry stools 03/30/2016  . CVA tenderness 03/30/2016  . Type 2 diabetes mellitus with hyperglycemia, with long-term current use of insulin ([place]) 01/27/2016  . Onychomycosis 01/27/2016  . Hepatic steatosis 11/27/2014  . Adenoma of left adrenal gland 11/27/2014  . Constipation 11/27/2014  . GERD (gastroesophageal reflux disease) 11/08/2013  . Epigastric pain 11/08/2013  . Uncontrolled diabetes mellitus ([place]) 11/08/2013  . ANKLE PAIN 02/02/2011   Past Medical History:  Diagnosis Date  . Diabetes mellitus without complication ([place])   . Mini stroke ([place])     Family History  Problem Relation Age of Onset  . Heart attack Mother   . Hypertension Mother   . Cancer Other   . Stroke Other   . Cancer Maternal Aunt   . Stroke Maternal Uncle   . Stroke Maternal Grandfather     Past Surgical History:  Procedure Laterality Date  . ABDOMINAL HYSTERECTOMY    . BLADDER SURGERY    . CHOLECYSTECTOMY     Social History   Occupational History  . Not on file  Tobacco Use  . Smoking status: Current Every Day Smoker     Packs/day: 1.00  . Smokeless tobacco: Never Used  Substance and Sexual Activity  . Alcohol use: Yes  . Drug use: No  . Sexual activity: Yes

## 2018-02-14 ENCOUNTER — Ambulatory Visit (INDEPENDENT_AMBULATORY_CARE_PROVIDER_SITE_OTHER): Payer: Self-pay | Admitting: Orthopaedic Surgery

## 2018-02-21 ENCOUNTER — Ambulatory Visit (INDEPENDENT_AMBULATORY_CARE_PROVIDER_SITE_OTHER): Payer: Self-pay

## 2018-02-21 ENCOUNTER — Ambulatory Visit (INDEPENDENT_AMBULATORY_CARE_PROVIDER_SITE_OTHER): Payer: Self-pay | Admitting: Orthopaedic Surgery

## 2018-02-21 DIAGNOSIS — M23221 Derangement of posterior horn of medial meniscus due to old tear or injury, right knee: Secondary | ICD-10-CM

## 2018-02-21 DIAGNOSIS — M25561 Pain in right knee: Principal | ICD-10-CM

## 2018-02-21 DIAGNOSIS — G8929 Other chronic pain: Secondary | ICD-10-CM

## 2018-02-21 DIAGNOSIS — M7051 Other bursitis of knee, right knee: Secondary | ICD-10-CM

## 2018-02-22 ENCOUNTER — Ambulatory Visit (INDEPENDENT_AMBULATORY_CARE_PROVIDER_SITE_OTHER): Payer: Self-pay | Admitting: Orthopaedic Surgery

## 2018-02-28 ENCOUNTER — Encounter (INDEPENDENT_AMBULATORY_CARE_PROVIDER_SITE_OTHER): Payer: Self-pay | Admitting: Orthopaedic Surgery

## 2018-02-28 ENCOUNTER — Ambulatory Visit (INDEPENDENT_AMBULATORY_CARE_PROVIDER_SITE_OTHER): Payer: Self-pay | Admitting: Orthopaedic Surgery

## 2018-02-28 ENCOUNTER — Ambulatory Visit (INDEPENDENT_AMBULATORY_CARE_PROVIDER_SITE_OTHER): Payer: Self-pay

## 2018-02-28 VITALS — BP 133/88 | HR 80 | Resp 16 | Ht 67.0 in | Wt 220.0 lb

## 2018-02-28 DIAGNOSIS — M25551 Pain in right hip: Secondary | ICD-10-CM

## 2018-02-28 DIAGNOSIS — G8929 Other chronic pain: Secondary | ICD-10-CM

## 2018-02-28 DIAGNOSIS — M25561 Pain in right knee: Secondary | ICD-10-CM

## 2018-02-28 DIAGNOSIS — M25552 Pain in left hip: Secondary | ICD-10-CM

## 2018-02-28 MED ORDER — BUPIVACAINE HCL 0.5 % IJ SOLN
2.0000 mL | INTRAMUSCULAR | Status: AC | PRN
  Administered 2018-02-28: 2 mL via INTRA_ARTICULAR

## 2018-02-28 MED ORDER — METHYLPREDNISOLONE ACETATE 40 MG/ML IJ SUSP
80.0000 mg | INTRAMUSCULAR | Status: AC | PRN
  Administered 2018-02-28: 80 mg

## 2018-02-28 MED ORDER — LIDOCAINE HCL 1 % IJ SOLN
2.0000 mL | INTRAMUSCULAR | Status: AC | PRN
  Administered 2018-02-28: 2 mL

## 2018-02-28 NOTE — Progress Notes (Signed)
Office Visit Note   Patient: Denise Tyler           Date of Birth: December 22, 1965           MRN: [id number] Visit Date: 02/28/2018              Requested by: Donella Stade, PA-C [address] [place], [place] [id number] PCP: Donella Stade, PA-C   Assessment & Plan: Visit Diagnoses:  1. Pain of both hip joints   2. Chronic pain of right knee     Plan: MRI scan right knee demonstrated some mild degenerative changes in the lateral compartment where she is not symptomatic.  Also noted to have a tear of the medial meniscus right knee near the root.  Long discussion regarding different treatment options.  [name] does not have any insurance.  I certainly discussed that arthroscopy.  Concerned about the cost for that without insurance.  I would like to try an cortisone injection and see if it makes a difference.  This is performed without difficulty.  She has Voltaren gel at home.  Reevaluate in 1 month.  Follow-Up Instructions: Return in about 1 month (around 03/31/2018).   Orders:  Orders Placed This Encounter  Procedures  . Large Joint Inj: R knee  . XR Pelvis 1-2 Views   No orders of the defined types were placed in this encounter.     Procedures: Large Joint Inj: R knee on 02/28/2018 4:13 PM Indications: pain and diagnostic evaluation Details: 25 G 1.5 in needle, anteromedial approach  Arthrogram: No  Medications: 2 mL lidocaine 1 %; 2 mL bupivacaine 0.5 %; 80 mg methylPREDNISolone acetate 40 MG/ML Procedure, treatment alternatives, risks and benefits explained, specific risks discussed. Consent was given by the patient. Immediately prior to procedure a time out was called to verify the correct patient, procedure, equipment, support staff and site/side marked as required. Patient was prepped and draped in the usual sterile fashion.       Clinical Data: No additional findings.   Subjective: Chief Complaint  Patient presents with  . Follow-up    MRI  REVIEW  Has been experiencing right knee pain since last summer.  No obvious injury or trauma.  Pain is been localized somewhat and along the medial compartment of her right knee occasional swelling.  She also is developed some nondescript pain in the area of her thigh.  In the course of the day she is up and down.  Because of her chronic pain an MRI scan was performed.  Results are as above.  She does not have insurance and is concerned about cost  HPI  Review of Systems  Constitutional: Negative for fever.  HENT: Negative for ear pain.   Eyes: Negative for pain.  Respiratory: Negative for cough and shortness of breath.   Cardiovascular: Positive for leg swelling.  Gastrointestinal: Positive for constipation and diarrhea.  Genitourinary: Negative for difficulty urinating.  Musculoskeletal: Negative for back pain and neck pain.  Skin: Negative for rash.  Allergic/Immunologic: Negative for food allergies.  Neurological: Positive for weakness and numbness.  Hematological: Bruises/bleeds easily.  Psychiatric/Behavioral: Positive for sleep disturbance.     Objective: Vital Signs: BP 133/88 (BP Location: Right Arm, Patient Position: Sitting, Cuff Size: Normal)   Pulse 80   Resp 16   Ht 5\' 7"  (1.702 m)   Wt 220 lb (99.8 kg)   BMI 34.46 kg/m   Physical Exam  Constitutional: She is oriented to  person, place, and time. She appears well-developed and well-nourished.  HENT:  Mouth/Throat: Oropharynx is clear and moist.  Eyes: Pupils are equal, round, and reactive to light. EOM are normal.  Pulmonary/Chest: Effort normal.  Neurological: She is alert and oriented to person, place, and time.  Skin: Skin is warm and dry.  Psychiatric: She has a normal mood and affect. Her behavior is normal.    Ortho Exam awake alert and oriented x3.  Comfortable sitting.  Some very mild discomfort over the greater trochanter of her right hip.  No groin pain with internal/external rotation.  Straight leg  raise negative.  Diffuse mild medial joint pain right knee with more pain along the posterior horn possibly consistent with a tear of the medial meniscus per the MRI scan.  No effusion.  No instability.  No popliteal mass or pain.  No calf pain.  No swelling distally.  Neurovascular exam intact   No specialty comments available.  Imaging: Xr Pelvis 1-2 Views  Result Date: 02/28/2018 AP the pelvis did not demonstrate any evidence of right hip pathology.    PMFS History: Patient Active Problem List   Diagnosis Date Noted  . No energy 01/30/2018  . Chronic pain of both knees 01/30/2018  . Trochanteric bursitis of right hip 01/06/2018  . Right knee pain 01/06/2018  . Hyperlipidemia associated with type 2 diabetes mellitus ([place]) 07/13/2017  . Primary osteoarthritis of right hip 07/13/2017  . Right hip pain 07/11/2017  . Hypertriglyceridemia 01/01/2017  . Anxiety state 12/25/2016  . Insomnia 12/25/2016  . Multiple joint pain 12/25/2016  . Black tarry stools 03/30/2016  . CVA tenderness 03/30/2016  . Type 2 diabetes mellitus with hyperglycemia, with long-term current use of insulin ([place]) 01/27/2016  . Onychomycosis 01/27/2016  . Hepatic steatosis 11/27/2014  . Adenoma of left adrenal gland 11/27/2014  . Constipation 11/27/2014  . GERD (gastroesophageal reflux disease) 11/08/2013  . Epigastric pain 11/08/2013  . Uncontrolled diabetes mellitus ([place]) 11/08/2013  . ANKLE PAIN 02/02/2011   Past Medical History:  Diagnosis Date  . Diabetes mellitus without complication ([place])   . Mini stroke ([place])     Family History  Problem Relation Age of Onset  . Heart attack Mother   . Hypertension Mother   . Cancer Other   . Stroke Other   . Cancer Maternal Aunt   . Stroke Maternal Uncle   . Stroke Maternal Grandfather     Past Surgical History:  Procedure Laterality Date  . ABDOMINAL HYSTERECTOMY    . BLADDER SURGERY    . CHOLECYSTECTOMY     Social History   Occupational History  .  Not on file  Tobacco Use  . Smoking status: Current Every Day Smoker    Packs/day: 1.00  . Smokeless tobacco: Never Used  Substance and Sexual Activity  . Alcohol use: Yes  . Drug use: No  . Sexual activity: Yes

## 2018-09-27 IMAGING — MR MR KNEE*R* W/O CM
6 series · 39 of 40 positions shown · non-contrast
Comparison: None.

CLINICAL DATA: No injury or surgery. Swelling and popping. Pain
posterior. Problems started 6 weeks ago

EXAM:
MRI OF THE RIGHT KNEE WITHOUT CONTRAST
TECHNIQUE: Multiplanar, multisequence MR imaging of the knee was performed. No
intravenous contrast was administered.

[Series 3: PD fat-sat · axial · 3.0mm · 0.33mm/px · z∈[-75,+41]mm · 8 of 36 slices shown (1 of 4)]
[im 1/36]
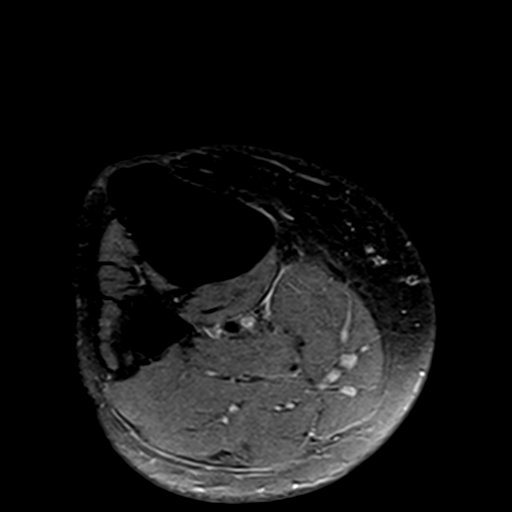
[im 6/36]
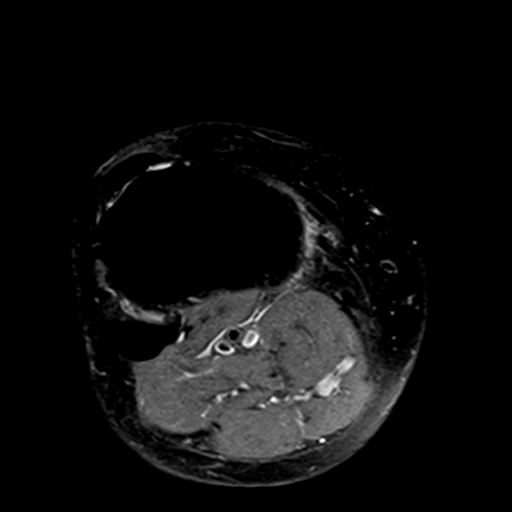
[im 11/36]
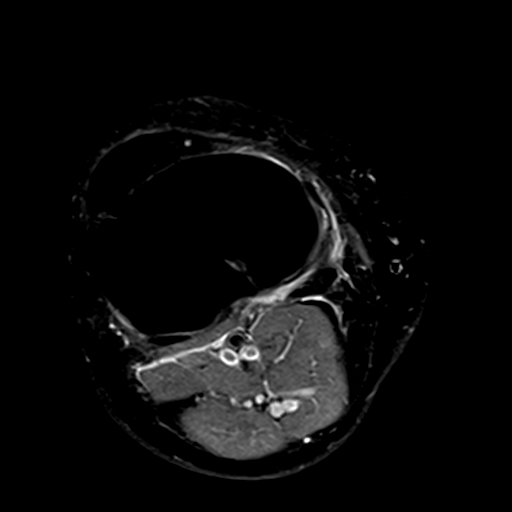
[im 16/36]
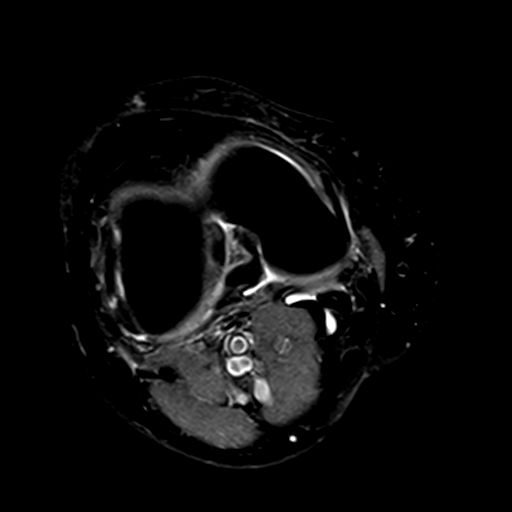
[im 21/36]
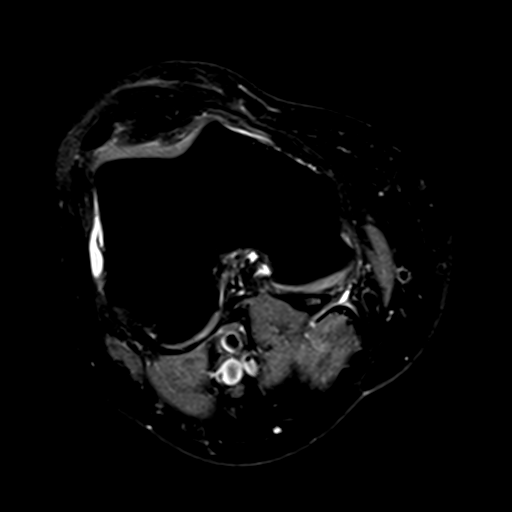
[im 26/36]
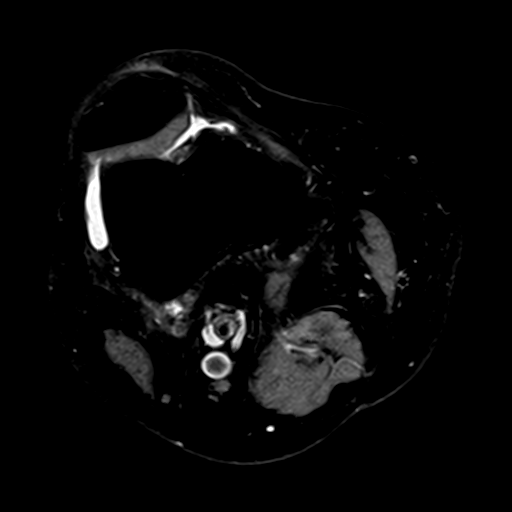
[im 31/36]
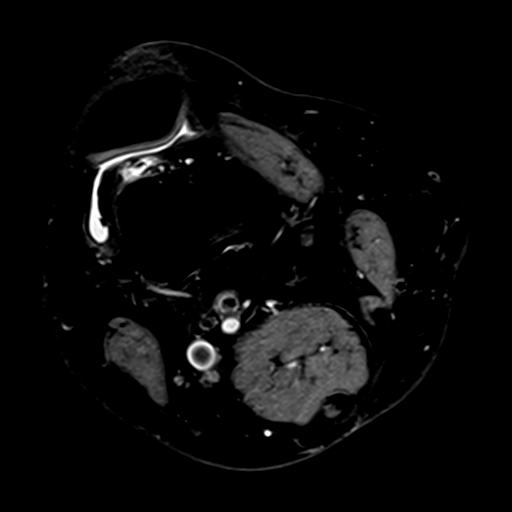
[im 36/36]
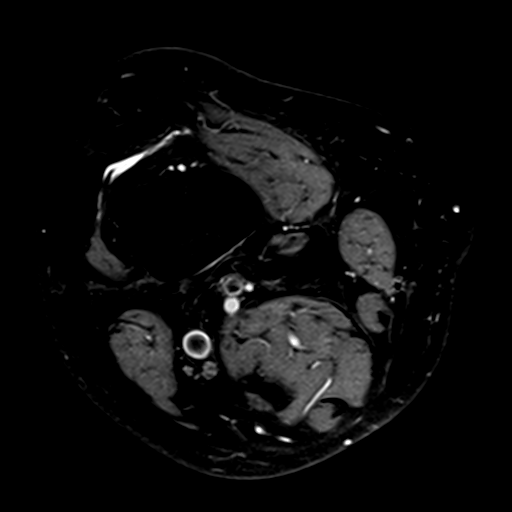

[Series 4: T1 · coronal · 3.0mm · 0.50mm/px · 6 of 34 slices shown]
[im 1/34]
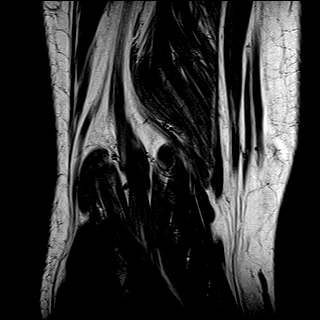
[im 6/34]
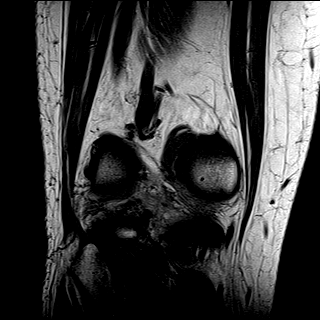
[im 12/34]
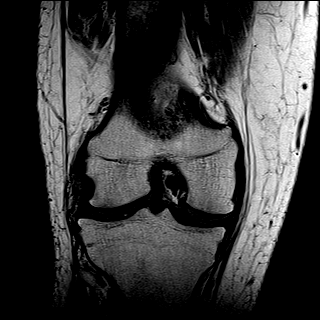
[im 17/34]
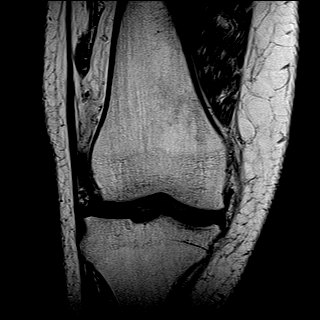
[im 23/34]
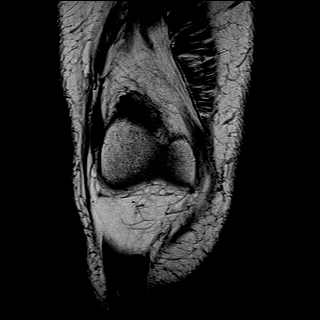
[im 28/34]
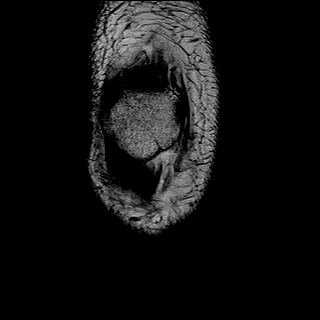

[Series 5: T2 fat-sat · coronal · 3.0mm · 0.50mm/px · 7 of 34 slices shown]
[im 1/34]
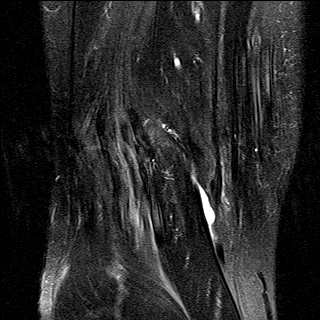
[im 6/34]
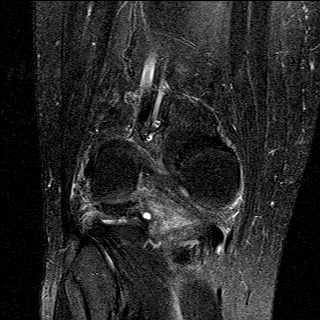
[im 12/34]
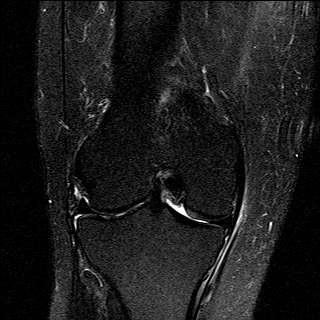
[im 17/34]
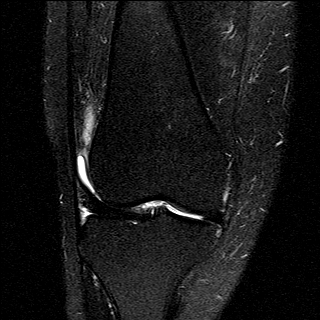
[im 23/34]
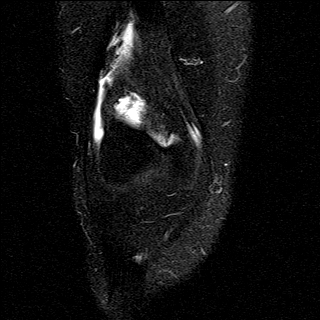
[im 28/34]
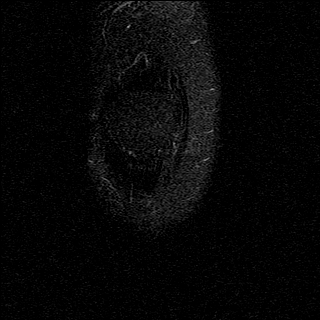
[im 34/34]
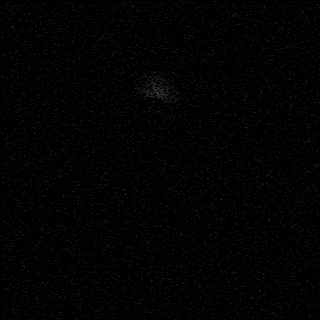

[Series 6: PD fat-sat · coronal · 3.0mm · 0.62mm/px · 7 of 34 slices shown (2 of 4)]
[im 1/34]
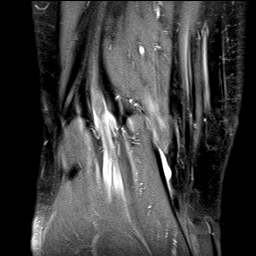
[im 6/34]
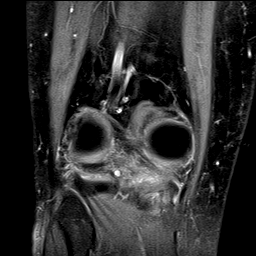
[im 12/34]
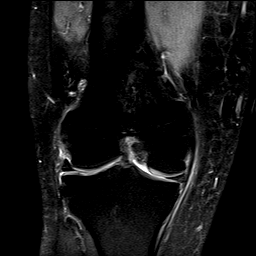
[im 17/34]
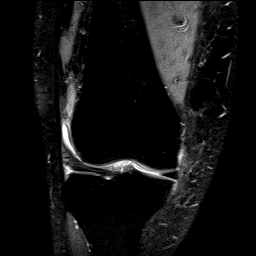
[im 23/34]
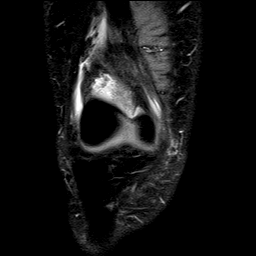
[im 28/34]
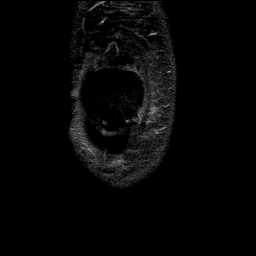
[im 34/34]
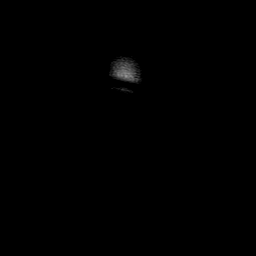

[Series 7: PD fat-sat · sagittal · 3.0mm · 0.62mm/px · 7 of 34 slices shown (3 of 4)]
[im 1/34]
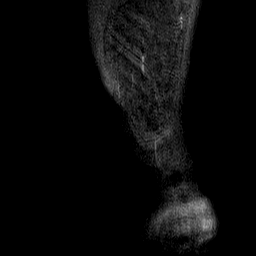
[im 6/34]
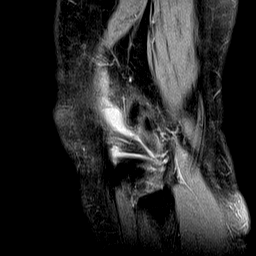
[im 12/34]
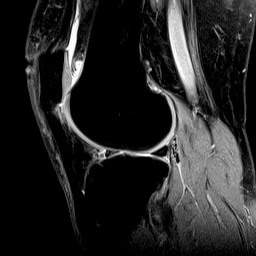
[im 17/34]
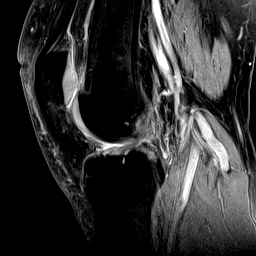
[im 23/34]
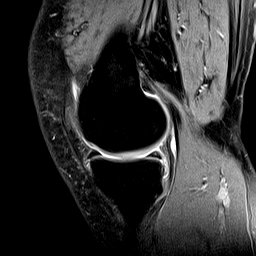
[im 28/34]
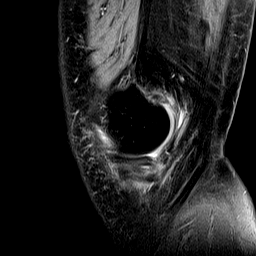
[im 34/34]
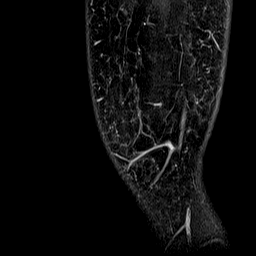

[Series 8: PD fat-sat · oblique · 2.0mm · 0.62mm/px · 4 of 19 slices shown (4 of 4)]
[im 1/19]
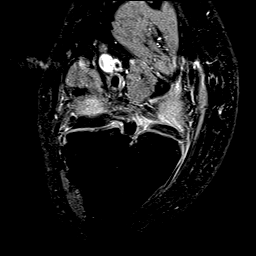
[im 7/19]
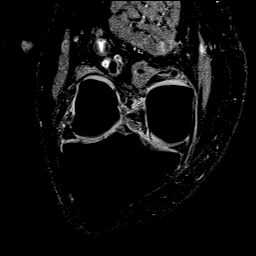
[im 13/19]
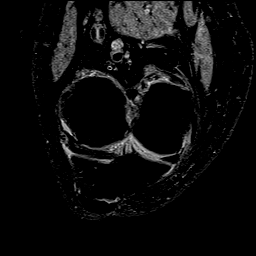
[im 19/19]
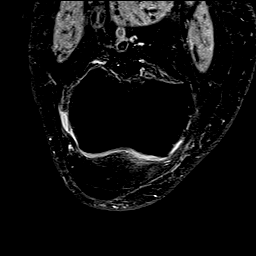

[39 of 40 positions shown; findings below may reference images not displayed]

FINDINGS: MENISCI

Medial meniscus: Radial tear of the posterior horn of the medial
meniscus adjacent to the meniscal root with peripheral meniscal
extrusion.

Lateral meniscus:  Intact.

LIGAMENTS

Cruciates: Intact PCL. Intact ACL which is slightly wavy which may
reflect mild ligamentous laxity. Increased signal within the ACL as
can be seen with mild mucinous degeneration.

Collaterals: Medial collateral ligament is intact. Lateral
collateral ligament complex is intact.

CARTILAGE

Patellofemoral:  No chondral defect.

Medial:  No chondral defect.

Lateral: Mild partial-thickness cartilage loss of the lateral
femorotibial compartment.

Joint: Small joint effusion. Edema in superolateral Hoffa's fat. No
plical thickening.

Popliteal Fossa:  Tiny Baker's cyst.  Intact popliteus tendon.

Extensor Mechanism: Intact quadriceps tendon. Mild tendinosis of the
proximal patellar tendon. Intact medial patellar retinaculum. Intact
lateral patellar retinaculum. Intact MPFL.

Bones:  No acute osseous abnormality.  No aggressive osseous lesion.

Other: Small amount of fluid in the pes anserine bursa.
IMPRESSION: 1. Radial tear of the posterior horn of the medial meniscus adjacent
to the meniscal root with peripheral meniscal extrusion.
2. Mild partial-thickness cartilage loss of the lateral femorotibial
compartment.
3. Mild pes anserine bursitis.

## 2018-10-28 ENCOUNTER — Ambulatory Visit (INDEPENDENT_AMBULATORY_CARE_PROVIDER_SITE_OTHER): Payer: BLUE CROSS/BLUE SHIELD | Admitting: Physician Assistant

## 2018-10-28 ENCOUNTER — Encounter: Payer: Self-pay | Admitting: Physician Assistant

## 2018-10-28 ENCOUNTER — Other Ambulatory Visit: Payer: Self-pay | Admitting: Physician Assistant

## 2018-10-28 VITALS — BP 140/89 | HR 100 | Temp 98.1°F | Ht 67.0 in | Wt 224.8 lb

## 2018-10-28 DIAGNOSIS — F411 Generalized anxiety disorder: Secondary | ICD-10-CM

## 2018-10-28 DIAGNOSIS — G4700 Insomnia, unspecified: Secondary | ICD-10-CM | POA: Diagnosis not present

## 2018-10-28 DIAGNOSIS — E1165 Type 2 diabetes mellitus with hyperglycemia: Secondary | ICD-10-CM | POA: Diagnosis not present

## 2018-10-28 DIAGNOSIS — J329 Chronic sinusitis, unspecified: Secondary | ICD-10-CM

## 2018-10-28 DIAGNOSIS — J40 Bronchitis, not specified as acute or chronic: Secondary | ICD-10-CM

## 2018-10-28 MED ORDER — TRAZODONE HCL 50 MG PO TABS: 25.0000 mg | ORAL_TABLET | Freq: Every evening | ORAL | 1 refills | Status: DC | PRN

## 2018-10-28 MED ORDER — HYDROCOD POLST-CPM POLST ER 10-8 MG/5ML PO SUER: 5.0000 mL | Freq: Two times a day (BID) | ORAL | 0 refills | Status: DC | PRN

## 2018-10-28 MED ORDER — LORAZEPAM 0.5 MG PO TABS: 0.5000 mg | ORAL_TABLET | Freq: Three times a day (TID) | ORAL | 2 refills | Status: DC | PRN

## 2018-10-28 MED ORDER — AZITHROMYCIN 250 MG PO TABS: ORAL_TABLET | ORAL | 0 refills | Status: DC

## 2018-10-28 MED ORDER — ALBUTEROL SULFATE HFA 108 (90 BASE) MCG/ACT IN AERS: 2.0000 | INHALATION_SPRAY | Freq: Four times a day (QID) | RESPIRATORY_TRACT | 0 refills | Status: DC | PRN

## 2018-10-28 MED ORDER — PREDNISONE 50 MG PO TABS: ORAL_TABLET | ORAL | 0 refills | Status: DC

## 2018-10-28 MED ORDER — BASAGLAR KWIKPEN 100 UNIT/ML ~~LOC~~ SOPN: 15.0000 [IU] | PEN_INJECTOR | Freq: Every day | SUBCUTANEOUS | 1 refills | Status: DC

## 2018-10-28 NOTE — Patient Instructions (Signed)
Acute Bronchitis, Adult Acute bronchitis is when air tubes (bronchi) in the lungs suddenly get swollen. The condition can make it hard to breathe. It can also cause these symptoms:  A cough.  Coughing up clear, yellow, or green mucus.  Wheezing.  Chest congestion.  Shortness of breath.  A fever.  Body aches.  Chills.  A sore throat. Follow these instructions at home:  Medicines  Take over-the-counter and prescription medicines only as told by your doctor.  If you were prescribed an antibiotic medicine, take it as told by your doctor. Do not stop taking the antibiotic even if you start to feel better. General instructions  Rest.  Drink enough fluids to keep your pee (urine) pale yellow.  Avoid smoking and secondhand smoke. If you smoke and you need help quitting, ask your doctor. Quitting will help your lungs heal faster.  Use an inhaler, cool mist vaporizer, or humidifier as told by your doctor.  Keep all follow-up visits as told by your doctor. This is important. How is this prevented? To lower your risk of getting this condition again:  Wash your hands often with soap and water. If you cannot use soap and water, use hand sanitizer.  Avoid contact with people who have cold symptoms.  Try not to touch your hands to your mouth, nose, or eyes.  Make sure to get the flu shot every year. Contact a doctor if:  Your symptoms do not get better in 2 weeks. Get help right away if:  You cough up blood.  You have chest pain.  You have very bad shortness of breath.  You become dehydrated.  You faint (pass out) or keep feeling like you are going to pass out.  You keep throwing up (vomiting).  You have a very bad headache.  Your fever or chills gets worse. This information is not intended to replace advice given to you by your health care provider. Make sure you discuss any questions you have with your health care provider. Document Released: 03/30/2008 Document  Revised: 05/26/2017 Document Reviewed: 04/01/2016 Elsevier Interactive Patient Education  2019 Elsevier Inc.  

## 2018-10-28 NOTE — Progress Notes (Signed)
Subjective:    Patient ID: Denise Tyler, female    DOB: 12/22/1965, 53 y.o.   MRN: [id number]  HPI  Pt is a 53 yo female with T2DM, GERD, hypertriglyceridemia who presents to the clinic with sinus pressure, congestion, ear pain for over a week.   Pt has not been here since may. She just got insurance. She does not want to address health maintaince at this time.  She continues to struggle with SOB, productive cough, headache, ST, wheezing, sinus pressure, and nasal congestion. She has been taking mucinex and sudafed and ibuprofen.    .. Active Ambulatory Problems    Diagnosis Date Noted  . ANKLE PAIN 02/02/2011  . GERD (gastroesophageal reflux disease) 11/08/2013  . Epigastric pain 11/08/2013  . Uncontrolled diabetes mellitus ([place]) 11/08/2013  . Hepatic steatosis 11/27/2014  . Adenoma of left adrenal gland 11/27/2014  . Constipation 11/27/2014  . Type 2 diabetes mellitus with hyperglycemia, with long-term current use of insulin ([place]) 01/27/2016  . Onychomycosis 01/27/2016  . Black tarry stools 03/30/2016  . CVA tenderness 03/30/2016  . Anxiety state 12/25/2016  . Insomnia 12/25/2016  . Multiple joint pain 12/25/2016  . Hypertriglyceridemia 01/01/2017  . Right hip pain 07/11/2017  . Hyperlipidemia associated with type 2 diabetes mellitus ([place]) 07/13/2017  . Primary osteoarthritis of right hip 07/13/2017  . Trochanteric bursitis of right hip 01/06/2018  . Right knee pain 01/06/2018  . No energy 01/30/2018  . Chronic pain of both knees 01/30/2018   Resolved Ambulatory Problems    Diagnosis Date Noted  . No Resolved Ambulatory Problems   Past Medical History:  Diagnosis Date  . Diabetes mellitus without complication ([place])   . Mini stroke [facility])          Review of Systems  All other systems reviewed and are negative.      Objective:   Physical Exam Vitals signs reviewed.  Constitutional:      Appearance: Normal appearance.  HENT:     Head: Normocephalic and  atraumatic.     Comments: Tenderness over maxillary sinuses.     Right Ear: Tympanic membrane and ear canal normal.     Left Ear: Tympanic membrane and ear canal normal.     Nose: Congestion present.     Mouth/Throat:     Mouth: Mucous membranes are moist.     Pharynx: Posterior oropharyngeal erythema present.  Eyes:     Conjunctiva/sclera: Conjunctivae normal.  Cardiovascular:     Rate and Rhythm: Normal rate and regular rhythm.     Pulses: Normal pulses.     Heart sounds: Normal heart sounds.  Pulmonary:     Breath sounds: Wheezing and rhonchi present.  Neurological:     General: No focal deficit present.     Mental Status: She is alert and oriented to person, place, and time.  Psychiatric:        Mood and Affect: Mood normal.        Behavior: Behavior normal.           Assessment & Plan:  [name] KitchenMarland KitchenJamieka was seen today for sinus problem.  Diagnoses and all orders for this visit:  Sinobronchitis -     azithromycin (ZITHROMAX) 250 MG tablet; Take 2 tablets now and then one tablet for 4 days. -     predniSONE (DELTASONE) 50 MG tablet; Take one tablet for 5 days. -     albuterol (PROVENTIL HFA;VENTOLIN HFA) 108 (90 Base) MCG/ACT inhaler; Inhale 2 puffs into the lungs  every 6 (six) hours as needed for wheezing or shortness of breath. -     chlorpheniramine-HYDROcodone ([place]) 10-8 MG/5ML SUER; Take 5 mLs by mouth every 12 (twelve) hours as needed for cough (cough, will cause drowsiness.).  Anxiety state -     LORazepam (ATIVAN) 0.5 MG tablet; Take 1 tablet (0.5 mg total) by mouth every 8 (eight) hours as needed for anxiety.  Insomnia, unspecified type -     traZODone (DESYREL) 50 MG tablet; Take 0.5-1 tablets (25-50 mg total) by mouth at bedtime as needed. for sleep  Uncontrolled type 2 diabetes mellitus with hyperglycemia (HCC) -     Insulin Glargine (BASAGLAR KWIKPEN) 100 UNIT/ML SOPN; Inject 0.15 mLs (15 Units total) into the skin at bedtime.   Refills given.  Follow  up soon to get health maintenance.   Pt's lungs have a lot of wheezing and been sick for over a week. Zpak/prednisone/albuterol and cough syrup at bedtime. Rest and hydrate. Follow up as needed. HO given.

## 2018-10-30 ENCOUNTER — Encounter: Payer: Self-pay | Admitting: Physician Assistant

## 2018-11-17 ENCOUNTER — Telehealth: Payer: Self-pay | Admitting: Physician Assistant

## 2018-11-17 DIAGNOSIS — J40 Bronchitis, not specified as acute or chronic: Principal | ICD-10-CM

## 2018-11-17 DIAGNOSIS — J329 Chronic sinusitis, unspecified: Secondary | ICD-10-CM

## 2018-11-17 MED ORDER — HYDROCOD POLST-CPM POLST ER 10-8 MG/5ML PO SUER: 5.0000 mL | Freq: Two times a day (BID) | ORAL | 0 refills | Status: DC | PRN

## 2018-11-17 NOTE — Telephone Encounter (Signed)
Sent cough syrup.  Start mucinex 600mg  bid.  Take albuterol inhaler 2 puffs every 4-6 hours.  If not better by Monday let me know.   Any fever?

## 2018-11-17 NOTE — Telephone Encounter (Signed)
Pt states she went to [facility] in [place] on file and they will not fill the Rx for Tussionex. States it has to be sent electronically, not a paper Rx.   Also reports she completed the ABX but still having cough/congestion/phlegm. Reports she couldn't take the prednisone because it made her heart race. Questions if she should just get the cough syrup and "ride it out" or if she should try another round of ABX. Routing.

## 2018-11-18 NOTE — Telephone Encounter (Signed)
Pt advised,verbalized understanding. 

## 2018-11-29 ENCOUNTER — Ambulatory Visit: Payer: BLUE CROSS/BLUE SHIELD | Admitting: Physician Assistant

## 2018-11-30 ENCOUNTER — Encounter: Payer: Self-pay | Admitting: Physician Assistant

## 2018-11-30 ENCOUNTER — Ambulatory Visit (INDEPENDENT_AMBULATORY_CARE_PROVIDER_SITE_OTHER): Payer: BLUE CROSS/BLUE SHIELD | Admitting: Physician Assistant

## 2018-11-30 VITALS — BP 143/78 | HR 96 | Ht 67.0 in | Wt 226.0 lb

## 2018-11-30 DIAGNOSIS — Z1211 Encounter for screening for malignant neoplasm of colon: Secondary | ICD-10-CM

## 2018-11-30 DIAGNOSIS — E1165 Type 2 diabetes mellitus with hyperglycemia: Secondary | ICD-10-CM

## 2018-11-30 DIAGNOSIS — J441 Chronic obstructive pulmonary disease with (acute) exacerbation: Secondary | ICD-10-CM

## 2018-11-30 DIAGNOSIS — Z1231 Encounter for screening mammogram for malignant neoplasm of breast: Secondary | ICD-10-CM

## 2018-11-30 DIAGNOSIS — Z720 Tobacco use: Secondary | ICD-10-CM

## 2018-11-30 LAB — POCT GLYCOSYLATED HEMOGLOBIN (HGB A1C): Hemoglobin A1C: 11.5 % — AB (ref 4.0–5.6)

## 2018-11-30 MED ORDER — SEMAGLUTIDE(0.25 OR 0.5MG/DOS) 2 MG/1.5ML ~~LOC~~ SOPN: 0.5000 mg | PEN_INJECTOR | SUBCUTANEOUS | 2 refills | Status: DC

## 2018-11-30 MED ORDER — INSULIN GLARGINE (2 UNIT DIAL) 300 UNIT/ML ~~LOC~~ SOPN: 15.0000 [IU] | PEN_INJECTOR | Freq: Every day | SUBCUTANEOUS | 2 refills | Status: DC

## 2018-11-30 MED ORDER — METHYLPREDNISOLONE ACETATE 80 MG/ML IJ SUSP
80.0000 mg | Freq: Once | INTRAMUSCULAR | Status: AC
  Administered 2018-11-30: 80 mg via INTRAMUSCULAR

## 2018-11-30 MED ORDER — AMBULATORY NON FORMULARY MEDICATION: 0 refills | Status: DC

## 2018-11-30 MED ORDER — METHYLPREDNISOLONE SODIUM SUCC 125 MG IJ SOLR
125.0000 mg | Freq: Once | INTRAMUSCULAR | Status: AC
  Administered 2018-11-30: 125 mg via INTRAMUSCULAR

## 2018-11-30 MED ORDER — VARENICLINE TARTRATE 0.5 MG X 11 & 1 MG X 42 PO MISC: ORAL | 0 refills | Status: DC

## 2018-11-30 MED ORDER — VARENICLINE TARTRATE 1 MG PO TABS: 1.0000 mg | ORAL_TABLET | Freq: Two times a day (BID) | ORAL | 1 refills | Status: DC

## 2018-11-30 MED ORDER — SITAGLIPTIN PHOSPHATE 100 MG PO TABS: 100.0000 mg | ORAL_TABLET | Freq: Every day | ORAL | 2 refills | Status: DC

## 2018-11-30 MED ORDER — DOXYCYCLINE HYCLATE 100 MG PO TABS: 100.0000 mg | ORAL_TABLET | Freq: Two times a day (BID) | ORAL | 0 refills | Status: DC

## 2018-11-30 NOTE — Progress Notes (Signed)
Subjective:    Patient ID: Denise Tyler, female    DOB: [birth year], 53 y.o.   MRN: [id number]  HPI  Pt is a 53 yo female with T2DM, current smoker, HLD, ongoing cough and congestion.   Pt just got insurance and goal to get healthy this year.   DM- she is not checking sugars because does not have glucometer. She admits to not taking insulin right now. She is just doing [place]. She is trying to make good DM decisions with diet. She has a lot of DM symptoms: increased thirst, urination, vision changes.   She has never gotten over bronchitis from a month ago. She was given prednision, zpak, tussionex. She continues to cough and feel SOB.   [name] Kitchen. Active Ambulatory Problems    Diagnosis Date Noted  . ANKLE PAIN 02/02/2011  . GERD (gastroesophageal reflux disease) 11/08/2013  . Epigastric pain 11/08/2013  . Uncontrolled diabetes mellitus ([place]) 11/08/2013  . Hepatic steatosis 11/27/2014  . Adenoma of left adrenal gland 11/27/2014  . Constipation 11/27/2014  . Type 2 diabetes mellitus with hyperglycemia, with long-term current use of insulin ([place]) 01/27/2016  . Onychomycosis 01/27/2016  . Black tarry stools 03/30/2016  . CVA tenderness 03/30/2016  . Anxiety state 12/25/2016  . Insomnia 12/25/2016  . Multiple joint pain 12/25/2016  . Hypertriglyceridemia 01/01/2017  . Right hip pain 07/11/2017  . Hyperlipidemia associated with type 2 diabetes mellitus ([place]) 07/13/2017  . Primary osteoarthritis of right hip 07/13/2017  . Trochanteric bursitis of right hip 01/06/2018  . Right knee pain 01/06/2018  . No energy 01/30/2018  . Chronic pain of both knees 01/30/2018   Resolved Ambulatory Problems    Diagnosis Date Noted  . No Resolved Ambulatory Problems   Past Medical History:  Diagnosis Date  . Diabetes mellitus without complication ([place])   . Mini stroke ([place])       Review of Systems See HPI>     Objective:   Physical Exam Vitals signs reviewed.  Constitutional:       Appearance: Normal appearance.  HENT:     Head: Normocephalic and atraumatic.     Right Ear: Tympanic membrane and ear canal normal.     Left Ear: Tympanic membrane and ear canal normal.     Nose: Nose normal. No congestion.     Mouth/Throat:     Mouth: Mucous membranes are moist.     Pharynx: No oropharyngeal exudate or posterior oropharyngeal erythema.  Eyes:     Conjunctiva/sclera: Conjunctivae normal.  Cardiovascular:     Rate and Rhythm: Normal rate and regular rhythm.     Pulses: Normal pulses.     Heart sounds: Normal heart sounds.  Pulmonary:     Effort: Pulmonary effort is normal.     Breath sounds: Rhonchi present. No wheezing.     Comments: Coarse crackles.  Neurological:     General: No focal deficit present.     Mental Status: She is alert and oriented to person, place, and time.  Psychiatric:        Mood and Affect: Mood normal.        Behavior: Behavior normal.           Assessment & Plan:  [name] KitchenMarland KitchenDiagnoses and all orders for this visit:  Uncontrolled type 2 diabetes mellitus with hyperglycemia (HCC) -     POCT glycosylated hemoglobin (Hb A1C) -     Insulin Glargine, 2 Unit Dial, (TOUJEO MAX SOLOSTAR) 300 UNIT/ML SOPN; Inject 15 Units into the  skin at bedtime. -     Semaglutide,0.25 or 0.5MG /DOS, (OZEMPIC, 0.25 OR 0.5 MG/DOSE,) 2 MG/1.5ML SOPN; Inject 0.5 mg into the skin once a week. -     sitaGLIPtin (JANUVIA) 100 MG tablet; Take 1 tablet (100 mg total) by mouth daily. -     AMBULATORY NON FORMULARY MEDICATION; Single glucometer  Colon cancer screening -     Ambulatory referral to Gastroenterology  Visit for screening mammogram -     MM 3D SCREEN BREAST BILATERAL  COPD exacerbation ([place]) -     doxycycline (VIBRA-TABS) 100 MG tablet; Take 1 tablet (100 mg total) by mouth 2 (two) times daily. -     methylPREDNISolone sodium succinate (SOLU-MEDROL) 125 mg/2 mL injection 125 mg -     methylPREDNISolone acetate (DEPO-MEDROL) injection 80 mg  Tobacco  abuse -     varenicline (CHANTIX STARTING MONTH PAK) 0.5 MG X 11 & 1 MG X 42 tablet; Take one 0.5mg  tablet by mouth once daily for 3 days, then increase to one 0.5mg  tablet twice daily for 3 days, then increase to one 1mg  tablet twice daily. -     varenicline (CHANTIX) 1 MG tablet; Take 1 tablet (1 mg total) by mouth 2 (two) times daily.   .. Results for orders placed or performed in visit on 11/30/18  POCT glycosylated hemoglobin (Hb A1C)  Result Value Ref Range   Hemoglobin A1C 11.5 (A) 4.0 - 5.6 %   HbA1c POC (<> result, manual entry)     HbA1c, POC (prediabetic range)     HbA1c, POC (controlled diabetic range)     Discussed sugars being out of control. Discussed how she must take control of this and take medication.  Pt aware with smoking and diabetes her CV risk is very elevated.   She is not using insulin. Restart toujeo at bedtime. Titrate until fasting sugars are 90-130.  Start ozempic once weekly. Discussed side effects. Coupon card given.  Can not tolerate metformin products.  On januvia.  Follow up in 3 months.  Declined pneumonia shot today.  Needs eye exam.   Discussed smoking cessation. Started chantix. Discussed 3 methods of quitting. Follow up in 3 months. Coupon card given. Discussed cravings.   Pt is having what appears to be COPD flare today. Once feeling better needs spirometry. Doxy sent to pharmacy. 2 steroid shots given today. She does not tolerate oral prednisone well. She may need a inhaler to control COPD progression and symptoms but reluctant to start.   Mammogram and colonoscopy ordered.    Follow up in 3 months.   [name] Kitchen.Spent 40 minutes with patient and greater than 50 percent of visit spent counseling patient regarding treatment plan/injection/how to use pens/smoking cessation/importance of mammogram/colonoscopy.

## 2018-11-30 NOTE — Patient Instructions (Signed)
StoP smoking in the next 3 months.   Start all diabetic medications.   Get mammogram and colonscopy.

## 2018-12-01 ENCOUNTER — Telehealth: Payer: Self-pay

## 2018-12-01 NOTE — Telephone Encounter (Signed)
Yesterday after you left, patient mentioned something about one of the new medications she was being placed on would possible help with her digestive system? I checked her medication list and did not see anything concerning her digestive health, so I just wanted to ask you and see? Please advise. Thanks!

## 2018-12-02 NOTE — Telephone Encounter (Signed)
Ozempic is the medication. It can delay gastric emptying and just make digestion better and help lose weight. It is the once weekly shot.

## 2018-12-02 NOTE — Telephone Encounter (Signed)
Called and left a voicemail notifying patient of medication. Office information provided if any further questions or concerns.

## 2018-12-05 ENCOUNTER — Encounter: Payer: Self-pay | Admitting: Physician Assistant

## 2018-12-05 DIAGNOSIS — Z720 Tobacco use: Secondary | ICD-10-CM | POA: Insufficient documentation

## 2018-12-06 ENCOUNTER — Telehealth: Payer: Self-pay | Admitting: Physician Assistant

## 2018-12-06 ENCOUNTER — Ambulatory Visit (INDEPENDENT_AMBULATORY_CARE_PROVIDER_SITE_OTHER): Payer: BLUE CROSS/BLUE SHIELD | Admitting: Physician Assistant

## 2018-12-06 ENCOUNTER — Encounter: Payer: Self-pay | Admitting: Physician Assistant

## 2018-12-06 VITALS — BP 103/67 | HR 106 | Temp 98.4°F | Ht 67.0 in | Wt 226.0 lb

## 2018-12-06 DIAGNOSIS — T50905A Adverse effect of unspecified drugs, medicaments and biological substances, initial encounter: Secondary | ICD-10-CM | POA: Diagnosis not present

## 2018-12-06 DIAGNOSIS — B379 Candidiasis, unspecified: Secondary | ICD-10-CM

## 2018-12-06 MED ORDER — FLUCONAZOLE 150 MG PO TABS: 150.0000 mg | ORAL_TABLET | Freq: Once | ORAL | 0 refills | Status: AC

## 2018-12-06 NOTE — Progress Notes (Signed)
Subjective:    Patient ID: Denise Tyler, female    DOB: [birth year], 53 y.o.   MRN: [id number]  HPI  Patient is a 53 year old female who presents to the clinic with some concerns that started after her steroid injections on 11/30/2018.  She has a history of reactions to oral prednisone.  We tried shot of Solu-Medrol and Depo-Medrol at her visit on 2/5.  She is felt like she has had intermittent hot flashes, weak and fatigued, feeling of overall anxiousness.  She said is definitely similar to how she felt on oral prednisone.  She does overall feel better with her bronchitis.  She is breathing better and having less of a cough.  She also admits to using the albuterol which gives her a more anxious jacked up feeling as well.  After being on antibiotic pt is having yeast infection symptoms. She request diflucan.   .. Active Ambulatory Problems    Diagnosis Date Noted  . ANKLE PAIN 02/02/2011  . GERD (gastroesophageal reflux disease) 11/08/2013  . Epigastric pain 11/08/2013  . Uncontrolled diabetes mellitus ([place]) 11/08/2013  . Hepatic steatosis 11/27/2014  . Adenoma of left adrenal gland 11/27/2014  . Constipation 11/27/2014  . Type 2 diabetes mellitus with hyperglycemia, with long-term current use of insulin ([place]) 01/27/2016  . Onychomycosis 01/27/2016  . Black tarry stools 03/30/2016  . CVA tenderness 03/30/2016  . Anxiety state 12/25/2016  . Insomnia 12/25/2016  . Multiple joint pain 12/25/2016  . Hypertriglyceridemia 01/01/2017  . Right hip pain 07/11/2017  . Hyperlipidemia associated with type 2 diabetes mellitus ([place]) 07/13/2017  . Primary osteoarthritis of right hip 07/13/2017  . Trochanteric bursitis of right hip 01/06/2018  . Right knee pain 01/06/2018  . No energy 01/30/2018  . Chronic pain of both knees 01/30/2018  . Tobacco abuse 12/05/2018   Resolved Ambulatory Problems    Diagnosis Date Noted  . No Resolved Ambulatory Problems   Past Medical History:  Diagnosis Date   . Diabetes mellitus without complication ([place])   . Mini stroke ([place])      Review of Systems See HPI>     Objective:   Physical Exam Vitals signs reviewed.  Constitutional:      Appearance: Normal appearance.     Comments: Intermittent hot flashes.   HENT:     Head: Normocephalic and atraumatic.     Right Ear: Tympanic membrane normal.     Left Ear: Tympanic membrane normal.  Cardiovascular:     Rate and Rhythm: Normal rate and regular rhythm.     Pulses: Normal pulses.  Pulmonary:     Effort: Pulmonary effort is normal.     Breath sounds: No wheezing.     Comments: Coarse breath sounds.  Neurological:     General: No focal deficit present.     Mental Status: She is alert.           Assessment & Plan:  [name] KitchenMarland KitchenDiagnoses and all orders for this visit:  Adverse effect of drug, initial encounter  Yeast infection -     fluconazole (DIFLUCAN) 150 MG tablet; Take 1 tablet (150 mg total) by mouth once for 1 dose.   I do believe that her symptoms are her intolerance to prednisone. Depo medrol stays in system active for at least 5 days. She should start to feel better and better. Follow up with any new symptoms. Reassurance that vitals and physical exam findings look great today.   Reassuring that bronchitis is improving.   Diflucan given  for after abx yeast infection.

## 2019-01-04 ENCOUNTER — Other Ambulatory Visit: Payer: Self-pay

## 2019-01-04 ENCOUNTER — Ambulatory Visit (INDEPENDENT_AMBULATORY_CARE_PROVIDER_SITE_OTHER): Payer: BLUE CROSS/BLUE SHIELD

## 2019-01-04 DIAGNOSIS — Z1231 Encounter for screening mammogram for malignant neoplasm of breast: Secondary | ICD-10-CM | POA: Diagnosis not present

## 2019-01-04 DIAGNOSIS — R928 Other abnormal and inconclusive findings on diagnostic imaging of breast: Secondary | ICD-10-CM | POA: Diagnosis not present

## 2019-01-06 ENCOUNTER — Other Ambulatory Visit: Payer: Self-pay | Admitting: Physician Assistant

## 2019-01-06 DIAGNOSIS — R928 Other abnormal and inconclusive findings on diagnostic imaging of breast: Secondary | ICD-10-CM

## 2019-01-06 NOTE — Progress Notes (Signed)
There was some asymmetry in each breast on mammogram. Imaging should be contacting you for more testing.

## 2019-01-10 NOTE — Progress Notes (Signed)
Ok to send letter to patient to call imaging if not heard about future films

## 2019-02-28 ENCOUNTER — Ambulatory Visit: Payer: BLUE CROSS/BLUE SHIELD | Admitting: Physician Assistant

## 2019-04-07 NOTE — Telephone Encounter (Signed)
Error

## 2019-08-02 ENCOUNTER — Telehealth: Payer: Self-pay

## 2019-08-02 NOTE — Telephone Encounter (Signed)
Patient called stating she has a tooth infection and is wanting some penicillin. Patient reports the whole side of her face is swollen and she has a sore throat and ear infection. Patient does not have a dentist.   I advised patient to call [facility] to be seen and treated appropriately.   FYI to PCP

## 2019-12-25 ENCOUNTER — Telehealth (INDEPENDENT_AMBULATORY_CARE_PROVIDER_SITE_OTHER): Payer: BC Managed Care – PPO | Admitting: Physician Assistant

## 2019-12-25 ENCOUNTER — Other Ambulatory Visit: Payer: Self-pay | Admitting: Physician Assistant

## 2019-12-25 ENCOUNTER — Encounter: Payer: Self-pay | Admitting: Physician Assistant

## 2019-12-25 VITALS — Temp 96.0°F | Ht 67.0 in | Wt 226.0 lb

## 2019-12-25 DIAGNOSIS — Z13220 Encounter for screening for lipoid disorders: Secondary | ICD-10-CM

## 2019-12-25 DIAGNOSIS — Z79899 Other long term (current) drug therapy: Secondary | ICD-10-CM

## 2019-12-25 DIAGNOSIS — E1165 Type 2 diabetes mellitus with hyperglycemia: Secondary | ICD-10-CM | POA: Diagnosis not present

## 2019-12-25 DIAGNOSIS — F411 Generalized anxiety disorder: Secondary | ICD-10-CM

## 2019-12-25 DIAGNOSIS — F17200 Nicotine dependence, unspecified, uncomplicated: Secondary | ICD-10-CM | POA: Insufficient documentation

## 2019-12-25 DIAGNOSIS — E785 Hyperlipidemia, unspecified: Secondary | ICD-10-CM

## 2019-12-25 DIAGNOSIS — E1169 Type 2 diabetes mellitus with other specified complication: Secondary | ICD-10-CM

## 2019-12-25 MED ORDER — TOUJEO MAX SOLOSTAR 300 UNIT/ML ~~LOC~~ SOPN: 15.0000 [IU] | PEN_INJECTOR | Freq: Every day | SUBCUTANEOUS | 2 refills | Status: DC

## 2019-12-25 MED ORDER — FREESTYLE LIBRE 14 DAY READER DEVI: 1.0000 | 11 refills | Status: DC

## 2019-12-25 MED ORDER — FREESTYLE LIBRE 14 DAY SENSOR MISC: 1.0000 | 11 refills | Status: DC

## 2019-12-25 MED ORDER — TOUJEO MAX SOLOSTAR 300 UNIT/ML ~~LOC~~ SOPN: 16.0000 [IU] | PEN_INJECTOR | Freq: Every day | SUBCUTANEOUS | 2 refills | Status: DC

## 2019-12-25 MED ORDER — LORAZEPAM 0.5 MG PO TABS: 0.5000 mg | ORAL_TABLET | Freq: Three times a day (TID) | ORAL | 1 refills | Status: DC | PRN

## 2019-12-25 MED ORDER — ROSUVASTATIN CALCIUM 20 MG PO TABS: 20.0000 mg | ORAL_TABLET | Freq: Every day | ORAL | 3 refills | Status: DC

## 2019-12-25 MED ORDER — OZEMPIC (0.25 OR 0.5 MG/DOSE) 2 MG/1.5ML ~~LOC~~ SOPN: 0.5000 mg | PEN_INJECTOR | SUBCUTANEOUS | 2 refills | Status: DC

## 2019-12-25 NOTE — Telephone Encounter (Signed)
Also received call from pharmacy that [facility] is only able to be dosed in 2 unit increments. They asked that we send new RX with 14 or 16 units or switch to regular Toujeo. Please advise.

## 2019-12-25 NOTE — Telephone Encounter (Signed)
Left message on machine for patient to call back.

## 2019-12-25 NOTE — Telephone Encounter (Signed)
Spoke with patient. She states she is unsure what happened with Lipitor but does state she doesn't always react well to medications. She is okay with trying Statin as long as it's "the most mild one".

## 2019-12-25 NOTE — Progress Notes (Signed)
Having trouble with work, but transferring jobs soon.  PHQ9 (7) -GAD7 (11) completed.  Needs refill insulin

## 2019-12-25 NOTE — Progress Notes (Signed)
Patient ID: Denise Tyler, female   DOB: 12/10/1965, 54 y.o.   MRN: [id number] .[name] KitchenVirtual Visit via Video Note  I connected with [name] on 12/25/19 at  8:30 AM EST by a video enabled telemedicine application and verified that I am speaking with the correct person using two identifiers.  Location: Patient: home Provider:    I discussed the limitations of evaluation and management by telemedicine and the availability of in person appointments. The patient expressed understanding and agreed to proceed.  History of Present Illness: Pt is a 54 yo female with T2DM, GERD, HLD, anxiety who calls into the clinic for refills.   She admits she has not been compliant with medications. She has not taken any medications since before [place] because she ran out. She is not checking her sugars. She is drinking sodas and sweet tea. She is not exercising.   She is anxious because of her work situation. She uses xanax as needed and did not get the last refill.   .. Active Ambulatory Problems    Diagnosis Date Noted  . ANKLE PAIN 02/02/2011  . GERD (gastroesophageal reflux disease) 11/08/2013  . Epigastric pain 11/08/2013  . Uncontrolled diabetes mellitus ([place]) 11/08/2013  . Hepatic steatosis 11/27/2014  . Adenoma of left adrenal gland 11/27/2014  . Constipation 11/27/2014  . Type 2 diabetes mellitus with hyperglycemia, with long-term current use of insulin ([place]) 01/27/2016  . Onychomycosis 01/27/2016  . CVA tenderness 03/30/2016  . Anxiety state 12/25/2016  . Insomnia 12/25/2016  . Multiple joint pain 12/25/2016  . Hypertriglyceridemia 01/01/2017  . Right hip pain 07/11/2017  . Hyperlipidemia associated with type 2 diabetes mellitus ([place]) 07/13/2017  . Primary osteoarthritis of right hip 07/13/2017  . Trochanteric bursitis of right hip 01/06/2018  . Right knee pain 01/06/2018  . No energy 01/30/2018  . Chronic pain of both knees 01/30/2018  . Tobacco abuse 12/05/2018  . Tobacco  dependence 12/25/2019   Resolved Ambulatory Problems    Diagnosis Date Noted  . Black tarry stools 03/30/2016   Past Medical History:  Diagnosis Date  . Diabetes mellitus without complication ([place])   . Mini stroke [facility])    Reviewed med, allergy, problem list.    Observations/Objective: No acute distress.  Normal breathing.   .. Depression screen [facility] 2/9 12/25/2019 07/07/2017 12/25/2016  Decreased Interest 0 1 1  Down, Depressed, Hopeless 0 1 1  PHQ - 2 Score 0 2 2  Altered sleeping 3 - 3  Tired, decreased energy 2 - 3  Change in appetite 0 - 1  Feeling bad or failure about yourself  0 - 0  Trouble concentrating 0 - 1  Moving slowly or fidgety/restless 2 - 2  Suicidal thoughts 0 - 0  PHQ-9 Score 7 - 12  Difficult doing work/chores Not difficult at all - -   .[name] Kitchen GAD 7 : Generalized Anxiety Score 12/25/2019 01/30/2018 12/25/2016  Nervous, Anxious, on Edge 3 2 3   Control/stop worrying 3 2 3   Worry too much - different things 0 2 3  Trouble relaxing 3 2 3   Restless 0 2 3  Easily annoyed or irritable 0 2 3  Afraid - awful might happen 2 2 3   Total GAD 7 Score 11 14 21   Anxiety Difficulty Somewhat difficult Somewhat difficult -     Assessment and Plan: [name] KitchenMarland KitchenYanellie was seen today for follow-up.  Diagnoses and all orders for this visit:  Uncontrolled type 2 diabetes mellitus with hyperglycemia ([place]) -  Insulin Glargine, 2 Unit Dial, (TOUJEO MAX SOLOSTAR) 300 UNIT/ML SOPN; Inject 15 Units into the skin at bedtime. -     Hemoglobin A1c -     COMPLETE METABOLIC PANEL WITH GFR -     Semaglutide,0.25 or 0.5MG /DOS, (OZEMPIC, 0.25 OR 0.5 MG/DOSE,) 2 MG/1.5ML SOPN; Inject 0.5 mg into the skin once a week. -     Continuous Blood Gluc Sensor (FREESTYLE LIBRE 14 DAY SENSOR) MISC; 1 Device by Does not apply route every 14 (fourteen) days. -     Continuous Blood Gluc Receiver (FREESTYLE LIBRE 14 DAY READER) DEVI; 1 applicator by Does not apply route every 14 (fourteen) days.  Anxiety state -      LORazepam (ATIVAN) 0.5 MG tablet; Take 1 tablet (0.5 mg total) by mouth every 8 (eight) hours as needed for anxiety. -     COMPLETE METABOLIC PANEL WITH GFR  Screening for lipid disorders -     Lipid Panel w/reflex Direct LDL  Medication management -     COMPLETE METABOLIC PANEL WITH GFR  Hyperlipidemia associated with type 2 diabetes mellitus (HCC) -     Lipid Panel w/reflex Direct LDL  Tobacco dependence   Pt is a very high risk of CV event. She is not on statin. She needs to be. I noticed after on the phone. Nurse will call and find out why.  A!C uncontrolled and not compliant with meds. Discussed importance and risk of not controlling glucose.  Started ozempic and toujeo since has insurance.  Needs to check sugars. Will try freestyle.  Fasting labs to be done before 3 month follow up.  Discussed DM diet and regular exercise.  BP not checked.  Needs eye exam. Pt is resistant to any vaccine.  Smoker, no ready to quit.   Refilled as needed ativan.   Lots of health prevention needed. She just got insurance and states will do a little at a time.   Follow Up Instructions:    I discussed the assessment and treatment plan with the patient. The patient was provided an opportunity to ask questions and all were answered. The patient agreed with the plan and demonstrated an understanding of the instructions.   The patient was advised to call back or seek an in-person evaluation if the symptoms worsen or if the condition fails to improve as anticipated.  I provided 25 minutes of non-face-to-face time during this encounter.   Iran Planas, PA-C

## 2019-12-26 ENCOUNTER — Telehealth: Payer: Self-pay | Admitting: Physician Assistant

## 2019-12-26 NOTE — Telephone Encounter (Signed)
Received fax for PA on 14 day Freestyle libre reader sent through cover my meds waiting on determination. - CF

## 2019-12-26 NOTE — Telephone Encounter (Signed)
Received fax for PA on Ozempic sent through cover my meds and received authorization.   Valid 12/26/19 - 12/24/2020 - CF

## 2019-12-28 MED ORDER — FREESTYLE LIBRE 14 DAY READER DEVI: 1.0000 | 0 refills | Status: DC

## 2019-12-28 NOTE — Addendum Note (Signed)
Addended byAnnamaria Helling on: 12/28/2019 09:44 AM   Modules accepted: Orders

## 2020-01-02 NOTE — Telephone Encounter (Signed)
Received fax from [facility] they approved the reader but denied the quantity. The allowed amount is 1 reader per 365 days. Placing in providers box for review. - CF  Reference # [id number]

## 2020-01-05 ENCOUNTER — Other Ambulatory Visit: Payer: Self-pay | Admitting: Physician Assistant

## 2020-01-05 DIAGNOSIS — Z1239 Encounter for other screening for malignant neoplasm of breast: Secondary | ICD-10-CM

## 2020-01-11 ENCOUNTER — Ambulatory Visit (INDEPENDENT_AMBULATORY_CARE_PROVIDER_SITE_OTHER): Payer: BC Managed Care – PPO

## 2020-01-11 ENCOUNTER — Other Ambulatory Visit: Payer: Self-pay

## 2020-01-11 DIAGNOSIS — Z1239 Encounter for other screening for malignant neoplasm of breast: Secondary | ICD-10-CM

## 2020-01-11 DIAGNOSIS — Z1231 Encounter for screening mammogram for malignant neoplasm of breast: Secondary | ICD-10-CM | POA: Diagnosis not present

## 2020-01-15 NOTE — Progress Notes (Signed)
Normal mammogram. Follow up in 1 year.

## 2020-01-30 ENCOUNTER — Other Ambulatory Visit: Payer: Self-pay

## 2020-01-30 ENCOUNTER — Encounter: Payer: Self-pay | Admitting: Physician Assistant

## 2020-01-30 ENCOUNTER — Ambulatory Visit (INDEPENDENT_AMBULATORY_CARE_PROVIDER_SITE_OTHER): Payer: BC Managed Care – PPO | Admitting: Physician Assistant

## 2020-01-30 VITALS — BP 136/85 | HR 89 | Temp 97.3°F | Wt 223.0 lb

## 2020-01-30 DIAGNOSIS — K5900 Constipation, unspecified: Secondary | ICD-10-CM | POA: Diagnosis not present

## 2020-01-30 DIAGNOSIS — R140 Abdominal distension (gaseous): Secondary | ICD-10-CM

## 2020-01-30 DIAGNOSIS — Z794 Long term (current) use of insulin: Secondary | ICD-10-CM

## 2020-01-30 DIAGNOSIS — F320 Major depressive disorder, single episode, mild: Secondary | ICD-10-CM

## 2020-01-30 DIAGNOSIS — E1165 Type 2 diabetes mellitus with hyperglycemia: Secondary | ICD-10-CM | POA: Diagnosis not present

## 2020-01-30 DIAGNOSIS — R1013 Epigastric pain: Secondary | ICD-10-CM

## 2020-01-30 DIAGNOSIS — F411 Generalized anxiety disorder: Secondary | ICD-10-CM

## 2020-01-30 DIAGNOSIS — R1011 Right upper quadrant pain: Secondary | ICD-10-CM | POA: Insufficient documentation

## 2020-01-30 LAB — POCT GLYCOSYLATED HEMOGLOBIN (HGB A1C): Hemoglobin A1C: 9 % — AB (ref 4.0–5.6)

## 2020-01-30 MED ORDER — ESCITALOPRAM OXALATE 5 MG PO TABS: 5.0000 mg | ORAL_TABLET | Freq: Every day | ORAL | 2 refills | Status: DC

## 2020-01-30 MED ORDER — TRULICITY 0.75 MG/0.5ML ~~LOC~~ SOAJ: 0.7500 mg | SUBCUTANEOUS | 2 refills | Status: DC

## 2020-01-30 MED ORDER — METFORMIN HCL ER 500 MG PO TB24: 500.0000 mg | ORAL_TABLET | Freq: Every day | ORAL | 2 refills | Status: DC

## 2020-01-30 NOTE — Progress Notes (Signed)
Subjective:    Patient ID: Denise Tyler, female    DOB: 1966-08-31, 54 y.o.   MRN: [id number]  HPI  Pt is a 54 yo female with uncontrolled T2DM, GERD, HLD who presents to the clinic for medication refills.   Pt only started toujeo at 16 units and did not get oxzempic due to cost. She has been checking her sugars more with libre. Running 200s to 300s. No hypoglycemic symptoms. No open sores or wounds.   No CP, palpitations.   She is having a lot of GI symptoms. She has pain and pressure behind umbillicus. She has pressure and fullness in epigastric area and RUQ. She is very constipated. She has hard bowel movements once a week. Denies any vomiting or reflux symptoms. No fever, chills   .[name] Kitchen Active Ambulatory Problems    Diagnosis Date Noted  . ANKLE PAIN 02/02/2011  . GERD (gastroesophageal reflux disease) 11/08/2013  . Epigastric pain 11/08/2013  . Uncontrolled diabetes mellitus ([place]) 11/08/2013  . Hepatic steatosis 11/27/2014  . Adenoma of left adrenal gland 11/27/2014  . Constipation 11/27/2014  . Type 2 diabetes mellitus with hyperglycemia, with long-term current use of insulin ([place]) 01/27/2016  . Onychomycosis 01/27/2016  . CVA tenderness 03/30/2016  . GAD (generalized anxiety disorder) 12/25/2016  . Insomnia 12/25/2016  . Multiple joint pain 12/25/2016  . Hypertriglyceridemia 01/01/2017  . Right hip pain 07/11/2017  . Hyperlipidemia associated with type 2 diabetes mellitus ([place]) 07/13/2017  . Primary osteoarthritis of right hip 07/13/2017  . Trochanteric bursitis of right hip 01/06/2018  . Right knee pain 01/06/2018  . No energy 01/30/2018  . Chronic pain of both knees 01/30/2018  . Tobacco abuse 12/05/2018  . Tobacco dependence 12/25/2019  . Right upper quadrant abdominal pain 01/30/2020  . Depression, major, single episode, mild ([place]) 02/06/2020   Resolved Ambulatory Problems    Diagnosis Date Noted  . Black tarry stools 03/30/2016   Past Medical History:   Diagnosis Date  . Diabetes mellitus without complication ([place])   . Mini stroke [facility])       Review of Systems See HPI.    Objective:   Physical Exam Vitals reviewed.  Constitutional:      Appearance: Normal appearance.  HENT:     Head: Normocephalic.  Neck:     Vascular: No carotid bruit.  Cardiovascular:     Rate and Rhythm: Normal rate and regular rhythm.     Heart sounds: No murmur.  Pulmonary:     Effort: Pulmonary effort is normal.     Breath sounds: Normal breath sounds.  Abdominal:     General: Bowel sounds are normal. There is distension.     Tenderness: There is abdominal tenderness. There is no right CVA tenderness, left CVA tenderness, guarding or rebound.  Neurological:     General: No focal deficit present.     Mental Status: She is alert and oriented to person, place, and time.  Psychiatric:        Mood and Affect: Mood normal.      .. Depression screen [facility] 2/9 01/30/2020 12/25/2019 07/07/2017 12/25/2016  Decreased Interest 0 0 1 1  Down, Depressed, Hopeless 0 0 1 1  PHQ - 2 Score 0 0 2 2  Altered sleeping 2 3 - 3  Tired, decreased energy 2 2 - 3  Change in appetite 2 0 - 1  Feeling bad or failure about yourself  0 0 - 0  Trouble concentrating 1 0 - 1  Moving  slowly or fidgety/restless 0 2 - 2  Suicidal thoughts 0 0 - 0  PHQ-9 Score 7 7 - 12  Difficult doing work/chores Somewhat difficult Not difficult at all - -   .[name] Kitchen GAD 7 : Generalized Anxiety Score 01/30/2020 12/25/2019 01/30/2018 12/25/2016  Nervous, Anxious, on Edge 2 3 2 3   Control/stop worrying 2 3 2 3   Worry too much - different things 2 0 2 3  Trouble relaxing 2 3 2 3   Restless 1 0 2 3  Easily annoyed or irritable 2 0 2 3  Afraid - awful might happen 2 2 2 3   Total GAD 7 Score 13 11 14 21   Anxiety Difficulty Somewhat difficult Somewhat difficult Somewhat difficult -         Assessment & Plan:  [name] KitchenMarland KitchenDiagnoses and all orders for this visit:  Type 2 diabetes mellitus with hyperglycemia, with  long-term current use of insulin (HCC) -     Dulaglutide (TRULICITY) A999333 [id number] SOPN; Inject 0.75 mg into the skin once a week. -     metFORMIN (GLUCOPHAGE XR) 500 MG 24 hr tablet; Take 1 tablet (500 mg total) by mouth daily with breakfast. -     POCT HgB A1C  Epigastric pain  Constipation, unspecified constipation type  Right upper quadrant abdominal pain  Depression, major, single episode, mild (HCC) -     escitalopram (LEXAPRO) 5 MG tablet; Take 1 tablet (5 mg total) by mouth daily.  GAD (generalized anxiety disorder)  Abdominal distension (gaseous) -     CT Abdomen Pelvis Wo Contrast   .. Results for orders placed or performed in visit on 01/30/20  POCT HgB A1C  Result Value Ref Range   Hemoglobin A1C 9.0 (A) 4.0 - 5.6 %   HbA1c POC (<> result, manual entry)     HbA1c, POC (prediabetic range)     HbA1c, POC (controlled diabetic range)     A1C has improved some but NOT to goal.  Add trulicity. Discussed how to use pen. If not covered let me know. She is aware it is a weekly shot.  Added metformin. Could help with constipation as well.  Fasting sugars need to be 90-130 increase toujeo by 2 units every 5 days until in that range.  BP almost to goal. Pt declines ACE/ARB for right now.  On statin.  Needs eye exam.  Declined pneumonia vaccine today.  Follow up in 3 months.   Discussed mood. Discussed exercise/counseling. Will start lexapro.   Ongoing abdominal pain. Will get CT for further evaluation.   Constipation perhaps metformin will help. If not consider linzess.

## 2020-01-31 ENCOUNTER — Encounter: Payer: Self-pay | Admitting: Physician Assistant

## 2020-02-06 ENCOUNTER — Telehealth: Payer: Self-pay | Admitting: Physician Assistant

## 2020-02-06 DIAGNOSIS — Z1211 Encounter for screening for malignant neoplasm of colon: Secondary | ICD-10-CM

## 2020-02-06 DIAGNOSIS — F320 Major depressive disorder, single episode, mild: Secondary | ICD-10-CM | POA: Insufficient documentation

## 2020-02-06 NOTE — Telephone Encounter (Signed)
Is she ok with colonoscopy? It was not declined at last visit and wondered if she was interested or to do cologuard.

## 2020-02-06 NOTE — Telephone Encounter (Signed)
Patient declines colonoscopy but is agreeable to Cologuard.   Cologuard order faxed to [phone] with confirmation received. They will contact the patient directly.

## 2020-02-08 ENCOUNTER — Other Ambulatory Visit: Payer: Self-pay

## 2020-02-08 ENCOUNTER — Ambulatory Visit (INDEPENDENT_AMBULATORY_CARE_PROVIDER_SITE_OTHER): Payer: BC Managed Care – PPO

## 2020-02-08 DIAGNOSIS — R1011 Right upper quadrant pain: Secondary | ICD-10-CM | POA: Diagnosis not present

## 2020-02-08 DIAGNOSIS — R140 Abdominal distension (gaseous): Secondary | ICD-10-CM

## 2020-02-09 MED ORDER — LINACLOTIDE 290 MCG PO CAPS: 290.0000 ug | ORAL_CAPSULE | Freq: Every day | ORAL | 1 refills | Status: DC

## 2020-02-09 NOTE — Addendum Note (Signed)
Addended by: Donella Stade on: 02/09/2020 01:47 PM   Modules accepted: Orders

## 2020-02-09 NOTE — Progress Notes (Signed)
[place] if metformin did not help to resolve constipation then start linzess once daily 30 minutes before breakfast. It was sent to pharmacy.

## 2020-02-09 NOTE — Progress Notes (Signed)
Call pt:  Lower chest clear. Liver looks great. You have a left adrenal adenoma but not causing pain. No reason for pain seen on CT scan today.

## 2020-05-07 ENCOUNTER — Ambulatory Visit (INDEPENDENT_AMBULATORY_CARE_PROVIDER_SITE_OTHER): Payer: BC Managed Care – PPO | Admitting: Physician Assistant

## 2020-05-07 DIAGNOSIS — Z5329 Procedure and treatment not carried out because of patient's decision for other reasons: Secondary | ICD-10-CM

## 2020-05-07 NOTE — Progress Notes (Signed)
No show

## 2020-11-04 ENCOUNTER — Other Ambulatory Visit: Payer: Self-pay

## 2020-11-04 ENCOUNTER — Ambulatory Visit (INDEPENDENT_AMBULATORY_CARE_PROVIDER_SITE_OTHER): Payer: BC Managed Care – PPO

## 2020-11-04 ENCOUNTER — Ambulatory Visit (INDEPENDENT_AMBULATORY_CARE_PROVIDER_SITE_OTHER): Payer: BC Managed Care – PPO | Admitting: Physician Assistant

## 2020-11-04 ENCOUNTER — Encounter: Payer: Self-pay | Admitting: Physician Assistant

## 2020-11-04 VITALS — BP 136/79 | HR 79 | Wt 220.4 lb

## 2020-11-04 DIAGNOSIS — M25551 Pain in right hip: Secondary | ICD-10-CM

## 2020-11-04 DIAGNOSIS — R2233 Localized swelling, mass and lump, upper limb, bilateral: Secondary | ICD-10-CM | POA: Diagnosis not present

## 2020-11-04 DIAGNOSIS — M19042 Primary osteoarthritis, left hand: Secondary | ICD-10-CM | POA: Diagnosis not present

## 2020-11-04 DIAGNOSIS — E1165 Type 2 diabetes mellitus with hyperglycemia: Secondary | ICD-10-CM

## 2020-11-04 DIAGNOSIS — G8929 Other chronic pain: Secondary | ICD-10-CM

## 2020-11-04 DIAGNOSIS — M79644 Pain in right finger(s): Secondary | ICD-10-CM | POA: Diagnosis not present

## 2020-11-04 DIAGNOSIS — M7989 Other specified soft tissue disorders: Secondary | ICD-10-CM | POA: Diagnosis not present

## 2020-11-04 DIAGNOSIS — M79645 Pain in left finger(s): Secondary | ICD-10-CM

## 2020-11-04 DIAGNOSIS — F320 Major depressive disorder, single episode, mild: Secondary | ICD-10-CM

## 2020-11-04 DIAGNOSIS — M19041 Primary osteoarthritis, right hand: Secondary | ICD-10-CM | POA: Diagnosis not present

## 2020-11-04 DIAGNOSIS — Z794 Long term (current) use of insulin: Secondary | ICD-10-CM

## 2020-11-04 DIAGNOSIS — M5441 Lumbago with sciatica, right side: Secondary | ICD-10-CM

## 2020-11-04 DIAGNOSIS — E1340 Other specified diabetes mellitus with diabetic neuropathy, unspecified: Secondary | ICD-10-CM

## 2020-11-04 DIAGNOSIS — Z9114 Patient's other noncompliance with medication regimen: Secondary | ICD-10-CM

## 2020-11-04 DIAGNOSIS — F411 Generalized anxiety disorder: Secondary | ICD-10-CM | POA: Diagnosis not present

## 2020-11-04 DIAGNOSIS — E119 Type 2 diabetes mellitus without complications: Secondary | ICD-10-CM | POA: Diagnosis not present

## 2020-11-04 DIAGNOSIS — G4700 Insomnia, unspecified: Secondary | ICD-10-CM

## 2020-11-04 DIAGNOSIS — Z13220 Encounter for screening for lipoid disorders: Secondary | ICD-10-CM

## 2020-11-04 LAB — POCT GLYCOSYLATED HEMOGLOBIN (HGB A1C): Hemoglobin A1C: 9.4 % — AB (ref 4.0–5.6)

## 2020-11-04 LAB — POCT UA - MICROALBUMIN
Albumin/Creatinine Ratio, Urine, POC: <30
Creatinine, POC: 200 mg/dL
Microalbumin Ur, POC: 10 mg/L

## 2020-11-04 MED ORDER — FREESTYLE LIBRE 14 DAY READER DEVI
1.0000 | 11 refills | Status: DC
Start: 2020-11-04 — End: 2022-03-27

## 2020-11-04 MED ORDER — TRAZODONE HCL 50 MG PO TABS: 25.0000 mg | ORAL_TABLET | Freq: Every evening | ORAL | 3 refills | Status: DC | PRN

## 2020-11-04 MED ORDER — LORAZEPAM 0.5 MG PO TABS: 0.5000 mg | ORAL_TABLET | Freq: Three times a day (TID) | ORAL | 2 refills | Status: DC | PRN

## 2020-11-04 MED ORDER — TRULICITY 0.75 MG/0.5ML ~~LOC~~ SOAJ: 0.7500 mg | SUBCUTANEOUS | 2 refills | Status: DC

## 2020-11-04 MED ORDER — METFORMIN HCL ER 500 MG PO TB24: 1000.0000 mg | ORAL_TABLET | Freq: Every day | ORAL | 5 refills | Status: DC

## 2020-11-04 MED ORDER — MELOXICAM 15 MG PO TABS: 15.0000 mg | ORAL_TABLET | Freq: Every day | ORAL | 1 refills | Status: DC

## 2020-11-04 MED ORDER — TOUJEO MAX SOLOSTAR 300 UNIT/ML ~~LOC~~ SOPN: 20.0000 [IU] | PEN_INJECTOR | Freq: Every day | SUBCUTANEOUS | 1 refills | Status: DC

## 2020-11-04 MED ORDER — FREESTYLE LIBRE 14 DAY SENSOR MISC
1.0000 | 11 refills | Status: DC
Start: 2020-11-04 — End: 2022-03-27

## 2020-11-04 NOTE — Patient Instructions (Signed)
Start trulicity.  

## 2020-11-04 NOTE — Progress Notes (Deleted)
   Subjective:    Patient ID: Denise Tyler, female    DOB: Dec 10, 1965, 55 y.o.   MRN: [id number]  HPI   2019-   Running  No GI bleed/no recent ulcers.   3 weeks.    Review of Systems     Objective:   Physical Exam        Assessment & Plan:

## 2020-11-04 NOTE — Progress Notes (Addendum)
Established Patient Office Visit  Subjective:  Patient ID: Denise Tyler, female    DOB: Oct 23, 1966  Age: 55 y.o. MRN: [id number]   HPI [name] presents for bilateral swelling and pain in her thumbs for the past 3 weeks, and for followup for diabetes mellitus and osteoarthritis.    About 3 weeks ago, she woke up with pain in her left thumb (MCP and IP) and thenar region that was made worse with extension and palpation of the joints.  After 1-2 days, the same sx presented on her right thumb as well.  She attempted to wrap and use wrist braces to support her thumbs over the past few weeks, but this has not succeeded in alleviating symptoms.  This has interfered with her ability to perform daily tasks and type at her job.  Her swelling is currently less than what it was, but she still has poor mobility of thumb with pain. She has not been taking medication to help with any symptoms. No hx of gout.   She is not checking her sugars regularly but she felt bad one day and checked them and were 445. She admits to not taking her insulin and medications like she should. She does have numbness and tingling in bilateral feet. She wonders if this from the sugars. No CP, palpitations, headaches, or vision changes.   She is not taking her cholesterol medication. She feels like her cholesterol is good.   She continues to have the right lateral leg pain. Pain is worse with external ROM.  Past Medical History:  Diagnosis Date  . Diabetes mellitus without complication (HCC)   . Mini stroke [facility])     Past Surgical History:  Procedure Laterality Date  . ABDOMINAL HYSTERECTOMY    . BLADDER SURGERY    . CHOLECYSTECTOMY      Family History  Problem Relation Age of Onset  . Heart attack Mother   . Hypertension Mother   . Cancer Other   . Stroke Other   . Cancer Maternal Aunt   . Stroke Maternal Uncle   . Stroke Maternal Grandfather     Social History   Socioeconomic History  . Marital  status: Married    Spouse name: Not on file  . Number of children: Not on file  . Years of education: Not on file  . Highest education level: Not on file  Occupational History  . Not on file  Tobacco Use  . Smoking status: Current Every Day Smoker    Packs/day: 1.00  . Smokeless tobacco: Never Used  Vaping Use  . Vaping Use: Never used  Substance and Sexual Activity  . Alcohol use: Yes  . Drug use: No  . Sexual activity: Yes  Other Topics Concern  . Not on file  Social History Narrative  . Not on file   Social Determinants of Health   Financial Resource Strain: Not on file  Food Insecurity: Not on file  Transportation Needs: Not on file  Physical Activity: Not on file  Stress: Not on file  Social Connections: Not on file  Intimate Partner Violence: Not on file    Outpatient Medications Prior to Visit  Medication Sig Dispense Refill  . AMBULATORY NON FORMULARY MEDICATION Single glucometer 1 each 0  . rosuvastatin (CRESTOR) 20 MG tablet Take 1 tablet (20 mg total) by mouth daily. 90 tablet 3  . Continuous Blood Gluc Receiver (FREESTYLE LIBRE 14 DAY READER) DEVI 1 applicator by Does not apply route every 14 (  fourteen) days. 1 each 0  . Continuous Blood Gluc Sensor (FREESTYLE LIBRE 14 DAY SENSOR) MISC 1 Device by Does not apply route every 14 (fourteen) days. 2 each 11  . escitalopram (LEXAPRO) 5 MG tablet Take 1 tablet (5 mg total) by mouth daily. 30 tablet 2  . Insulin Glargine, 2 Unit Dial, (TOUJEO MAX SOLOSTAR) 300 UNIT/ML SOPN Inject 16 Units into the skin at bedtime. 2 pen 2  . linaclotide (LINZESS) 290 MCG CAPS capsule Take 1 capsule (290 mcg total) by mouth daily. 90 capsule 1  . LORazepam (ATIVAN) 0.5 MG tablet Take 1 tablet (0.5 mg total) by mouth every 8 (eight) hours as needed for anxiety. 20 tablet 1  . metFORMIN (GLUCOPHAGE XR) 500 MG 24 hr tablet Take 1 tablet (500 mg total) by mouth daily with breakfast. 30 tablet 2  . traZODone (DESYREL) 50 MG tablet Take  0.5-1 tablets (25-50 mg total) by mouth at bedtime as needed. for sleep 90 tablet 1  . albuterol (PROVENTIL HFA;VENTOLIN HFA) 108 (90 Base) MCG/ACT inhaler Inhale 2 puffs into the lungs every 6 (six) hours as needed for wheezing or shortness of breath. (Patient not taking: Reported on 11/04/2020) 1 Inhaler 0  . Dulaglutide (TRULICITY) 0.75 MG/0.5ML SOPN Inject 0.75 mg into the skin once a week. (Patient not taking: Reported on 11/04/2020) 4 pen 2   No facility-administered medications prior to visit.    Allergies  Allergen Reactions  . Metformin And Related     diarrhea  . Xigduo Xr [Dapagliflozin-Metformin Hcl Er]     Yeast infections    ROS Review of Systems    Objective:    Physical Exam Vitals reviewed.  Constitutional:      Appearance: Normal appearance. [name].  Cardiovascular:     Rate and Rhythm: Normal rate and regular rhythm.     Pulses: Normal pulses.  Pulmonary:     Effort: Pulmonary effort is normal.     Breath sounds: Normal breath sounds.  Abdominal:     General: Bowel sounds are normal.     Palpations: Abdomen is soft.  Musculoskeletal:     Comments: Bilateral swollen, tender and slightly warm thumbs with tenderness over MCP, IP joints to palpation. Pain with any ROM activity Thumb strength 1/5, bilaterally.  Negative finklestein, bilaterally.   Right hip: Pain with external ROM.  No pain to palpation over greater trochanter. Negative SLR. Tightness in right buttocks.  Pain with resisted leg strength. 5/5 lower ext strength.  Neurological:     General: No focal deficit present.     Mental Status: She is alert and oriented to person, place, and time.  Psychiatric:        Mood and Affect: Mood normal.     BP 136/79   Pulse 79   Wt 220 lb 6.4 oz (100 kg)   SpO2 95%   BMI 34.52 kg/m  Wt Readings from Last 3 Encounters:  11/04/20 220 lb 6.4 oz (100 kg)  01/30/20 223 lb (101.2 kg)  12/25/19 226 lb (102.5 kg)     Health Maintenance Due   Topic Date Due  . Hepatitis C Screening  Never done  . OPHTHALMOLOGY EXAM  Never done  . PAP SMEAR-Modifier  10/26/2010  . FOOT EXAM  12/01/2019   .[name] Kitchen Lab Results  Component Value Date   HGBA1C 9.4 (A) 11/04/2020      Assessment & Plan:  [name] KitchenMarland KitchenDiagnoses and all orders for this visit:  Bilateral thumb pain -  Uric acid -     Rheumatoid factor -     DG Hip Unilat W OR W/O Pelvis 2-3 Views Right -     meloxicam (MOBIC) 15 MG tablet; Take 1 tablet (15 mg total) by mouth daily. -     Cyclic citrul peptide antibody, IgG -     ANA -     TSH -     CBC with Differential/Platelet -     DG Finger Thumb Left -     DG Finger Thumb Right  Diabetes mellitus with hemoglobin A1c goal of 7.0%-8.0% (HCC) -     POCT glycosylated hemoglobin (Hb A1C) -     POCT UA - Microalbumin  Neuropathy due to secondary diabetes (HCC) -     insulin glargine, 2 Unit Dial, (TOUJEO MAX SOLOSTAR) 300 UNIT/ML Solostar Pen; Inject 20 Units into the skin at bedtime. -     metFORMIN (GLUCOPHAGE XR) 500 MG 24 hr tablet; Take 2 tablets (1,000 mg total) by mouth daily with breakfast. -     Continuous Blood Gluc Receiver (FREESTYLE LIBRE 14 DAY READER) DEVI; 1 applicator by Does not apply route every 14 (fourteen) days. -     Continuous Blood Gluc Sensor (FREESTYLE LIBRE 14 DAY SENSOR) MISC; 1 Device by Does not apply route every 14 (fourteen) days. -     COMPLETE METABOLIC PANEL WITH GFR  Anxiety state -     LORazepam (ATIVAN) 0.5 MG tablet; Take 1 tablet (0.5 mg total) by mouth every 8 (eight) hours as needed for anxiety. -     COMPLETE METABOLIC PANEL WITH GFR  Type 2 diabetes mellitus with hyperglycemia, with long-term current use of insulin (HCC) -     Dulaglutide (TRULICITY) 0.75 MG/0.5ML SOPN; Inject 0.75 mg into the skin once a week.  Depression, major, single episode, mild (HCC)  Insomnia, unspecified type -     traZODone (DESYREL) 50 MG tablet; Take 0.5-1 tablets (25-50 mg total) by mouth at bedtime as  needed. for sleep  Screening for hyperlipidemia -     Lipid Panel w/reflex Direct LDL  Non compliance w medication regimen  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-sided sciatica   Bilateral thumb pain/swelling: labs ordered to look for signs of gout. Will get xrays seems more like OA. Encouraged to ice, rest, and start mobic daily. May need injections with Dr. Karie Schwalbe.   Chronic right sided back, hip and leg pain worsening. Will get xrays today. Likely needs PT. Start mobic could help with this as well after prednisone burst after xrays to determine if more hip or low back. Keep follow up and could consider other meds like gabapentin or cymbalta to help with pain and neuropathy.   A1C is not to goal.  Pt is not taking medication as prescribed. Need to be on GLP-1. Increased metformin. Needs to be on toujeo nightly. Do not skip nights.  BP close to goal. Not on ACE. Not taking statin. Lipid ordered. Explained risk.  micro albumin normal today.  .. Diabetic Foot Exam - Simple   Simple Foot Form Visual Inspection No deformities, no ulcerations, no other skin breakdown bilaterally: Yes Sensation Testing See comments: Yes Pulse Check Posterior Tibialis and Dorsalis pulse intact bilaterally: Yes Comments Left foot: decreased sensation over lateral toes and midfoot.  Right foot: decreased sensation over great toe, midfoot, and heel.     Pt needs eye exam.  Pt declines all vaccines. Flu/Covid/pneumonia.  Discussed medication compliance.   Trazodone for  sleep is working.   Ativan as needed for anxiety. She uses is sparingly as she has not had a refill in quite sometime.   Follow-up: Return in about 3 months (around 02/02/2021).    [name] KitchenHarlon Flor PA-C, have reviewed and agree with the above documentation in it's entirety.

## 2020-11-05 ENCOUNTER — Encounter: Payer: Self-pay | Admitting: Physician Assistant

## 2020-11-05 ENCOUNTER — Telehealth: Payer: Self-pay | Admitting: Physician Assistant

## 2020-11-05 DIAGNOSIS — M1811 Unilateral primary osteoarthritis of first carpometacarpal joint, right hand: Secondary | ICD-10-CM | POA: Insufficient documentation

## 2020-11-05 DIAGNOSIS — M79645 Pain in left finger(s): Secondary | ICD-10-CM | POA: Insufficient documentation

## 2020-11-05 DIAGNOSIS — Z9114 Patient's other noncompliance with medication regimen: Secondary | ICD-10-CM | POA: Insufficient documentation

## 2020-11-05 DIAGNOSIS — G8929 Other chronic pain: Secondary | ICD-10-CM | POA: Insufficient documentation

## 2020-11-05 DIAGNOSIS — M79644 Pain in right finger(s): Secondary | ICD-10-CM | POA: Insufficient documentation

## 2020-11-05 DIAGNOSIS — F411 Generalized anxiety disorder: Secondary | ICD-10-CM | POA: Insufficient documentation

## 2020-11-05 DIAGNOSIS — M461 Sacroiliitis, not elsewhere classified: Secondary | ICD-10-CM | POA: Insufficient documentation

## 2020-11-05 DIAGNOSIS — M1812 Unilateral primary osteoarthritis of first carpometacarpal joint, left hand: Secondary | ICD-10-CM | POA: Insufficient documentation

## 2020-11-05 DIAGNOSIS — F419 Anxiety disorder, unspecified: Secondary | ICD-10-CM | POA: Insufficient documentation

## 2020-11-05 MED ORDER — PREDNISONE 50 MG PO TABS: ORAL_TABLET | ORAL | 0 refills | Status: DC

## 2020-11-05 NOTE — Progress Notes (Signed)
Your hip xrays look good but you do have a lot of inflammation/arthritic changes in your SI joint. I think you should consider formal physical therapy and see Dr. Darene Lamer, sports medicine specialist, in office to consider an injection in SI joint.

## 2020-11-05 NOTE — Progress Notes (Signed)
Both thumbs have mild arthritic changes. Let me know in next few days if mobic is helping if not we might consider a burst of prednisone just until you can get in with Dr. Darene Lamer.

## 2020-11-06 NOTE — Telephone Encounter (Signed)
Left message on machine for patient letting her know instructions and to call with any questions.

## 2021-01-06 ENCOUNTER — Other Ambulatory Visit: Payer: Self-pay

## 2021-01-06 ENCOUNTER — Emergency Department (INDEPENDENT_AMBULATORY_CARE_PROVIDER_SITE_OTHER)
Admission: EM | Admit: 2021-01-06 | Discharge: 2021-01-06 | Disposition: A | Discharge status: Other Acute Inpatient Hospital | Payer: BC Managed Care – PPO | Source: Home / Self Care

## 2021-01-06 ENCOUNTER — Encounter: Payer: Self-pay | Admitting: *Deleted

## 2021-01-06 DIAGNOSIS — I2510 Atherosclerotic heart disease of native coronary artery without angina pectoris: Secondary | ICD-10-CM | POA: Diagnosis not present

## 2021-01-06 DIAGNOSIS — Z794 Long term (current) use of insulin: Secondary | ICD-10-CM | POA: Diagnosis not present

## 2021-01-06 DIAGNOSIS — R079 Chest pain, unspecified: Secondary | ICD-10-CM

## 2021-01-06 DIAGNOSIS — F17200 Nicotine dependence, unspecified, uncomplicated: Secondary | ICD-10-CM

## 2021-01-06 DIAGNOSIS — I517 Cardiomegaly: Secondary | ICD-10-CM | POA: Diagnosis not present

## 2021-01-06 DIAGNOSIS — Z791 Long term (current) use of non-steroidal anti-inflammatories (NSAID): Secondary | ICD-10-CM | POA: Diagnosis not present

## 2021-01-06 DIAGNOSIS — Z79899 Other long term (current) drug therapy: Secondary | ICD-10-CM | POA: Diagnosis not present

## 2021-01-06 DIAGNOSIS — Z8673 Personal history of transient ischemic attack (TIA), and cerebral infarction without residual deficits: Secondary | ICD-10-CM | POA: Diagnosis not present

## 2021-01-06 DIAGNOSIS — E1165 Type 2 diabetes mellitus with hyperglycemia: Secondary | ICD-10-CM

## 2021-01-06 DIAGNOSIS — R112 Nausea with vomiting, unspecified: Secondary | ICD-10-CM

## 2021-01-06 DIAGNOSIS — Z888 Allergy status to other drugs, medicaments and biological substances status: Secondary | ICD-10-CM | POA: Diagnosis not present

## 2021-01-06 DIAGNOSIS — Z8249 Family history of ischemic heart disease and other diseases of the circulatory system: Secondary | ICD-10-CM | POA: Diagnosis not present

## 2021-01-06 DIAGNOSIS — E119 Type 2 diabetes mellitus without complications: Secondary | ICD-10-CM | POA: Diagnosis not present

## 2021-01-06 DIAGNOSIS — I214 Non-ST elevation (NSTEMI) myocardial infarction: Secondary | ICD-10-CM | POA: Diagnosis not present

## 2021-01-06 DIAGNOSIS — I10 Essential (primary) hypertension: Secondary | ICD-10-CM

## 2021-01-06 DIAGNOSIS — F419 Anxiety disorder, unspecified: Secondary | ICD-10-CM | POA: Diagnosis not present

## 2021-01-06 DIAGNOSIS — I491 Atrial premature depolarization: Secondary | ICD-10-CM | POA: Diagnosis not present

## 2021-01-06 DIAGNOSIS — I498 Other specified cardiac arrhythmias: Secondary | ICD-10-CM

## 2021-01-06 DIAGNOSIS — R0989 Other specified symptoms and signs involving the circulatory and respiratory systems: Secondary | ICD-10-CM | POA: Diagnosis not present

## 2021-01-06 DIAGNOSIS — R61 Generalized hyperhidrosis: Secondary | ICD-10-CM

## 2021-01-06 DIAGNOSIS — R0902 Hypoxemia: Secondary | ICD-10-CM | POA: Diagnosis not present

## 2021-01-06 DIAGNOSIS — R0789 Other chest pain: Secondary | ICD-10-CM | POA: Diagnosis not present

## 2021-01-06 DIAGNOSIS — J811 Chronic pulmonary edema: Secondary | ICD-10-CM | POA: Diagnosis not present

## 2021-01-06 DIAGNOSIS — E785 Hyperlipidemia, unspecified: Secondary | ICD-10-CM

## 2021-01-06 DIAGNOSIS — R5383 Other fatigue: Secondary | ICD-10-CM

## 2021-01-06 DIAGNOSIS — F17210 Nicotine dependence, cigarettes, uncomplicated: Secondary | ICD-10-CM | POA: Diagnosis not present

## 2021-01-06 MED ORDER — ASPIRIN 81 MG PO CHEW
324.0000 mg | CHEWABLE_TABLET | Freq: Once | ORAL | Status: AC
  Administered 2021-01-06: 324 mg via ORAL

## 2021-01-06 NOTE — ED Triage Notes (Addendum)
Patient reports nausea that started yesterday afternoon later developed vomitng. Unable to hold down food or liquid. C/o progressive CP described as heavy. Mother with history of MI. C/o CP, nausea, shakiness. Type 2 diabetic. Current glucose 239. 324mg  ASA given chewable

## 2021-01-06 NOTE — ED Provider Notes (Signed)
[facility]    CSN: [id number] Arrival date & time: 01/06/21  0907      History   Chief Complaint Chief Complaint  Patient presents with  . Emesis  . Chest Pain    HPI Denise Tyler is a 55 y.o. female.   Reports chest pain, nausea, fatigue, weakness, shortness of breath, vomiting, left neck and arm pain since this morning at 02:30. Has medical history of uncontrolled type 2 diabetes, tobacco dependence (cigarettes), HTN, HLD, CVA. Reports family hx MI in her mother. Has never had this combination of symptoms in the past. Has not taken any medications today. Symptoms are worse with activity, but not really relieved by rest.   ROS per HPI   The history is provided by the patient.  Emesis Chest Pain Associated symptoms: vomiting     Past Medical History:  Diagnosis Date  . Diabetes mellitus without complication ([place])   . Mini stroke [facility])     Patient Active Problem List   Diagnosis Date Noted  . Degenerative arthritis of thumb, left 11/05/2020  . Degenerative arthritis of thumb, right 11/05/2020  . 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 Non compliance w medication regimen 11/05/2020  . Bilateral thumb pain 11/05/2020  . Anxiety state 11/05/2020  . 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-sided sciatica 11/05/2020  . Depression, major, single episode, mild ([place]) 02/06/2020  . Right upper quadrant abdominal pain 01/30/2020  . Tobacco dependence 12/25/2019  . Tobacco abuse 12/05/2018  . No energy 01/30/2018  . Chronic pain of both knees 01/30/2018  . Trochanteric bursitis of right hip 01/06/2018  . Right knee pain 01/06/2018  . Hyperlipidemia associated with type 2 diabetes mellitus ([place]) 07/13/2017  . Primary osteoarthritis of right hip 07/13/2017  . Right hip pain 07/11/2017  . Hypertriglyceridemia 01/01/2017  . GAD (generalized anxiety disorder) 12/25/2016  . Insomnia 12/25/2016  . Multiple joint pain 12/25/2016  . CVA  tenderness 03/30/2016  . Type 2 diabetes mellitus with hyperglycemia, with long-term current use of insulin ([place]) 01/27/2016  . Onychomycosis 01/27/2016  . Hepatic steatosis 11/27/2014  . Adenoma of left adrenal gland 11/27/2014  . Constipation 11/27/2014  . GERD (gastroesophageal reflux disease) 11/08/2013  . Epigastric pain 11/08/2013  . Uncontrolled diabetes mellitus ([place]) 11/08/2013  . ANKLE PAIN 02/02/2011    Past Surgical History:  Procedure Laterality Date  . ABDOMINAL HYSTERECTOMY    . BLADDER SURGERY    . CHOLECYSTECTOMY      OB History   No obstetric history on file.      Home Medications    Prior to Admission medications   Medication Sig Start Date End Date Taking? Authorizing Provider  albuterol (PROVENTIL HFA;VENTOLIN HFA) 108 (90 Base) MCG/ACT inhaler Inhale 2 puffs into the lungs every 6 (six) hours as needed for wheezing or shortness of breath. Patient not taking: Reported on 11/04/2020 10/28/18   Donella Stade, PA-C  AMBULATORY NON FORMULARY MEDICATION Single glucometer 11/30/18   Breeback, Jade L, PA-C  Continuous Blood Gluc Receiver (FREESTYLE LIBRE 14 DAY READER) DEVI 1 applicator by Does not apply route every 14 (fourteen) days. 11/04/20   Breeback, Royetta Car, PA-C  Continuous Blood Gluc Sensor (FREESTYLE LIBRE 14 DAY SENSOR) MISC 1 Device by Does not apply route every 14 (fourteen) days. 11/04/20   Breeback, Jade L, PA-C  Dulaglutide (TRULICITY) 6.07 PX/1.0GY SOPN Inject 0.75 mg into the skin once a week. 11/04/20   Breeback, Jade L, PA-C  insulin glargine, 2  Unit Dial, (TOUJEO MAX SOLOSTAR) 300 UNIT/ML Solostar Pen Inject 20 Units into the skin at bedtime. 11/04/20   Breeback, Jade L, PA-C  LORazepam (ATIVAN) 0.5 MG tablet Take 1 tablet (0.5 mg total) by mouth every 8 (eight) hours as needed for anxiety. 11/04/20   Breeback, Royetta Car, PA-C  meloxicam (MOBIC) 15 MG tablet Take 1 tablet (15 mg total) by mouth daily. 11/04/20   Donella Stade, PA-C  metFORMIN  (GLUCOPHAGE XR) 500 MG 24 hr tablet Take 2 tablets (1,000 mg total) by mouth daily with breakfast. 11/04/20 11/04/21  Breeback, [name] L, PA-C  predniSONE (DELTASONE) 50 MG tablet One tab PO daily for 5 days. 11/05/20   Breeback, Jade L, PA-C  rosuvastatin (CRESTOR) 20 MG tablet Take 1 tablet (20 mg total) by mouth daily. 12/25/19   Breeback, Jade L, PA-C  traZODone (DESYREL) 50 MG tablet Take 0.5-1 tablets (25-50 mg total) by mouth at bedtime as needed. for sleep 11/04/20   Donella Stade, PA-C    Family History Family History  Problem Relation Age of Onset  . Heart attack Mother   . Hypertension Mother   . Cancer Other   . Stroke Other   . Cancer Maternal Aunt   . Stroke Maternal Uncle   . Stroke Maternal Grandfather     Social History Social History   Tobacco Use  . Smoking status: Current Every Day Smoker    Packs/day: 1.00  . Smokeless tobacco: Never Used  Vaping Use  . Vaping Use: Never used  Substance Use Topics  . Alcohol use: Yes  . Drug use: No     Allergies   Metformin and related and Xigduo xr [dapagliflozin-metformin hcl er]   Review of Systems Review of Systems  Cardiovascular: Positive for chest pain.  Gastrointestinal: Positive for vomiting.     Physical Exam Triage Vital Signs ED Triage Vitals  Enc Vitals Group     BP 01/06/21 0916 (!) 152/88     Pulse Rate 01/06/21 0916 85     Resp 01/06/21 0916 20     Temp 01/06/21 0916 98.2 F (36.8 C)     Temp Source 01/06/21 0916 Oral     SpO2 01/06/21 0916 99 %     Weight --      Height --      Head Circumference --      Peak Flow --      Pain Score 01/06/21 0917 7     Pain Loc --      Pain Edu? --      Excl. in [place]? --    No data found.  Updated Vital Signs BP (!) 152/88 (BP Location: Left Arm)   Pulse 85   Temp 98.2 F (36.8 C) (Oral)   Resp 20   SpO2 99%     Physical Exam Vitals and nursing note reviewed.  Constitutional:      General: She is in acute distress.     Appearance: She is  obese. She is ill-appearing and diaphoretic.  HENT:     Head: Normocephalic and atraumatic.  Eyes:     Extraocular Movements: Extraocular movements intact.     Conjunctiva/sclera: Conjunctivae normal.     Pupils: Pupils are equal, round, and reactive to light.  Cardiovascular:     Rate and Rhythm: Normal rate and regular rhythm.     Pulses:          Carotid pulses are 2+ on the right side and 2+  on the left side.      Radial pulses are 2+ on the right side and 2+ on the left side.       Dorsalis pedis pulses are 2+ on the right side and 2+ on the left side.     Heart sounds: No murmur heard. No friction rub. No gallop.   Pulmonary:     Effort: Pulmonary effort is normal. Tachypnea present. No accessory muscle usage or respiratory distress.     Breath sounds: No stridor. Examination of the right-lower field reveals decreased breath sounds and wheezing. Examination of the left-lower field reveals decreased breath sounds and wheezing. Decreased breath sounds and wheezing present. No rhonchi or rales.  Chest:     Chest wall: No mass, deformity, tenderness, crepitus or edema. There is no dullness to percussion.  Abdominal:     Palpations: Abdomen is soft.     Tenderness: There is no abdominal tenderness.  Musculoskeletal:        General: Normal range of motion.     Cervical back: Normal range of motion and neck supple.     Right lower leg: No tenderness. No edema.     Left lower leg: No tenderness. No edema.  Skin:    General: Skin is warm.     Capillary Refill: Capillary refill takes less than 2 seconds.     Coloration: Skin is pale. Skin is not cyanotic.     Findings: No ecchymosis, erythema or rash.     Nails: There is no clubbing.  Neurological:     General: No focal deficit present.     Mental Status: She is alert and oriented to person, place, and time.  Psychiatric:        Mood and Affect: Mood is anxious.        Behavior: Behavior normal.      UC Treatments / Results   Labs (all labs ordered are listed, but only abnormal results are displayed) Labs Reviewed - No data to display  EKG   Radiology No results found.  Procedures Procedures (including critical care time)  Medications Ordered in UC Medications  aspirin chewable tablet 324 mg (324 mg Oral Given 01/06/21 0929)    Initial Impression / Assessment and Plan / UC Course  I have reviewed the triage vital signs and the nursing notes.  Pertinent labs & imaging results that were available during my care of the patient were reviewed by me and considered in my medical decision making (see chart for details).    Chest Pain Nausea and vomiting Fatigue Weakness HTN HLD Uncontrolled Type 2 DM Smoker Bigeminy  EKG in office shows multiple PVCs and Bigeminy ASA 325 po given in office O2 at 2lpm via [place] in office IV placed in office Discussed with patient that she needs a higher level of care in the ER for further cardiac workup Verbalized agreement with treatment plan Patient to ER via EMS   Final Clinical Impressions(s) / UC Diagnoses   Final diagnoses:  Nausea and vomiting, intractability of vomiting not specified, unspecified vomiting type  Chest pain, unspecified type  Diaphoresis  Other fatigue  Essential hypertension  Uncontrolled type 2 diabetes mellitus with hyperglycemia ([place])  Bigeminy  Smoker  Hyperlipidemia, unspecified hyperlipidemia type     Discharge Instructions     Go to the ER for further evaluation and treatment    ED Prescriptions    None     PDMP not reviewed this encounter.   Faustino Congress, NP  01/06/21 0952  

## 2021-01-06 NOTE — ED Notes (Signed)
Pt leaves unit on stretcher w/ [facility] EMS. She is a/o and VSS, on heart monitor.

## 2021-01-06 NOTE — ED Notes (Signed)
Patient is being discharged from the Urgent Care and sent to the Emergency Department via ambulance . Per Kirkland Hun, NP, patient is in need of higher level of care due to cp, sob, nausea, ekg changes. Patient is aware and verbalizes understanding of plan of care.  Vitals:   01/06/21 0916  BP: (!) 152/88  Pulse: 85  Resp: 20  Temp: 98.2 F (36.8 C)  SpO2: 99%

## 2021-01-06 NOTE — Discharge Instructions (Signed)
Go to the ER for further evaluation and treatment. 

## 2021-01-07 ENCOUNTER — Telehealth: Payer: Self-pay

## 2021-01-07 DIAGNOSIS — Z01810 Encounter for preprocedural cardiovascular examination: Secondary | ICD-10-CM | POA: Diagnosis not present

## 2021-01-07 DIAGNOSIS — I25119 Atherosclerotic heart disease of native coronary artery with unspecified angina pectoris: Secondary | ICD-10-CM | POA: Diagnosis not present

## 2021-01-07 DIAGNOSIS — Z8673 Personal history of transient ischemic attack (TIA), and cerebral infarction without residual deficits: Secondary | ICD-10-CM | POA: Diagnosis not present

## 2021-01-07 DIAGNOSIS — E1165 Type 2 diabetes mellitus with hyperglycemia: Secondary | ICD-10-CM | POA: Diagnosis not present

## 2021-01-07 DIAGNOSIS — Z4682 Encounter for fitting and adjustment of non-vascular catheter: Secondary | ICD-10-CM | POA: Diagnosis not present

## 2021-01-07 DIAGNOSIS — E785 Hyperlipidemia, unspecified: Secondary | ICD-10-CM | POA: Diagnosis not present

## 2021-01-07 DIAGNOSIS — I517 Cardiomegaly: Secondary | ICD-10-CM | POA: Diagnosis not present

## 2021-01-07 DIAGNOSIS — M549 Dorsalgia, unspecified: Secondary | ICD-10-CM | POA: Diagnosis not present

## 2021-01-07 DIAGNOSIS — R0789 Other chest pain: Secondary | ICD-10-CM | POA: Diagnosis not present

## 2021-01-07 DIAGNOSIS — Z888 Allergy status to other drugs, medicaments and biological substances status: Secondary | ICD-10-CM | POA: Diagnosis not present

## 2021-01-07 DIAGNOSIS — I5189 Other ill-defined heart diseases: Secondary | ICD-10-CM | POA: Diagnosis not present

## 2021-01-07 DIAGNOSIS — Z20822 Contact with and (suspected) exposure to covid-19: Secondary | ICD-10-CM | POA: Diagnosis not present

## 2021-01-07 DIAGNOSIS — Z6836 Body mass index (BMI) 36.0-36.9, adult: Secondary | ICD-10-CM | POA: Diagnosis not present

## 2021-01-07 DIAGNOSIS — Z48812 Encounter for surgical aftercare following surgery on the circulatory system: Secondary | ICD-10-CM | POA: Diagnosis not present

## 2021-01-07 DIAGNOSIS — J939 Pneumothorax, unspecified: Secondary | ICD-10-CM | POA: Diagnosis not present

## 2021-01-07 DIAGNOSIS — F17210 Nicotine dependence, cigarettes, uncomplicated: Secondary | ICD-10-CM | POA: Diagnosis not present

## 2021-01-07 DIAGNOSIS — R079 Chest pain, unspecified: Secondary | ICD-10-CM | POA: Diagnosis not present

## 2021-01-07 DIAGNOSIS — Z720 Tobacco use: Secondary | ICD-10-CM | POA: Diagnosis not present

## 2021-01-07 DIAGNOSIS — R918 Other nonspecific abnormal finding of lung field: Secondary | ICD-10-CM | POA: Diagnosis not present

## 2021-01-07 DIAGNOSIS — Z951 Presence of aortocoronary bypass graft: Secondary | ICD-10-CM | POA: Diagnosis not present

## 2021-01-07 DIAGNOSIS — I088 Other rheumatic multiple valve diseases: Secondary | ICD-10-CM | POA: Diagnosis not present

## 2021-01-07 DIAGNOSIS — J984 Other disorders of lung: Secondary | ICD-10-CM | POA: Diagnosis not present

## 2021-01-07 DIAGNOSIS — Z794 Long term (current) use of insulin: Secondary | ICD-10-CM | POA: Diagnosis not present

## 2021-01-07 DIAGNOSIS — Z791 Long term (current) use of non-steroidal anti-inflammatories (NSAID): Secondary | ICD-10-CM | POA: Diagnosis not present

## 2021-01-07 DIAGNOSIS — R112 Nausea with vomiting, unspecified: Secondary | ICD-10-CM | POA: Diagnosis not present

## 2021-01-07 DIAGNOSIS — I6523 Occlusion and stenosis of bilateral carotid arteries: Secondary | ICD-10-CM | POA: Diagnosis not present

## 2021-01-07 DIAGNOSIS — F419 Anxiety disorder, unspecified: Secondary | ICD-10-CM | POA: Diagnosis not present

## 2021-01-07 DIAGNOSIS — Z8249 Family history of ischemic heart disease and other diseases of the circulatory system: Secondary | ICD-10-CM | POA: Diagnosis not present

## 2021-01-07 DIAGNOSIS — I214 Non-ST elevation (NSTEMI) myocardial infarction: Secondary | ICD-10-CM | POA: Diagnosis not present

## 2021-01-07 DIAGNOSIS — R0989 Other specified symptoms and signs involving the circulatory and respiratory systems: Secondary | ICD-10-CM | POA: Diagnosis not present

## 2021-01-07 DIAGNOSIS — I2583 Coronary atherosclerosis due to lipid rich plaque: Secondary | ICD-10-CM | POA: Diagnosis not present

## 2021-01-07 DIAGNOSIS — E119 Type 2 diabetes mellitus without complications: Secondary | ICD-10-CM | POA: Diagnosis not present

## 2021-01-07 DIAGNOSIS — Z79899 Other long term (current) drug therapy: Secondary | ICD-10-CM | POA: Diagnosis not present

## 2021-01-07 DIAGNOSIS — M1990 Unspecified osteoarthritis, unspecified site: Secondary | ICD-10-CM | POA: Diagnosis not present

## 2021-01-07 DIAGNOSIS — I2510 Atherosclerotic heart disease of native coronary artery without angina pectoris: Secondary | ICD-10-CM | POA: Diagnosis not present

## 2021-01-07 DIAGNOSIS — E669 Obesity, unspecified: Secondary | ICD-10-CM | POA: Diagnosis not present

## 2021-01-07 DIAGNOSIS — I080 Rheumatic disorders of both mitral and aortic valves: Secondary | ICD-10-CM | POA: Diagnosis not present

## 2021-01-07 DIAGNOSIS — J811 Chronic pulmonary edema: Secondary | ICD-10-CM | POA: Diagnosis not present

## 2021-01-07 DIAGNOSIS — E6609 Other obesity due to excess calories: Secondary | ICD-10-CM | POA: Diagnosis not present

## 2021-01-07 DIAGNOSIS — J9811 Atelectasis: Secondary | ICD-10-CM | POA: Diagnosis not present

## 2021-01-07 NOTE — Telephone Encounter (Signed)
Denise Tyler called and states she was in the ED yesterday. 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 She left AMA because she has to take care of some things. She wants to know what [name] thinks she should do for treatment. I advised her to go back to the ED ASAP.

## 2021-01-07 NOTE — Telephone Encounter (Signed)
Called pt and left message to encourage her to go back to ED.

## 2021-01-31 DIAGNOSIS — R9431 Abnormal electrocardiogram [ECG] [EKG]: Secondary | ICD-10-CM | POA: Diagnosis not present

## 2021-01-31 DIAGNOSIS — Z951 Presence of aortocoronary bypass graft: Secondary | ICD-10-CM | POA: Diagnosis not present

## 2021-01-31 DIAGNOSIS — I517 Cardiomegaly: Secondary | ICD-10-CM | POA: Diagnosis not present

## 2021-02-03 ENCOUNTER — Ambulatory Visit: Payer: BC Managed Care – PPO | Admitting: Physician Assistant

## 2021-02-03 DIAGNOSIS — E119 Type 2 diabetes mellitus without complications: Secondary | ICD-10-CM

## 2021-02-03 DIAGNOSIS — I2510 Atherosclerotic heart disease of native coronary artery without angina pectoris: Secondary | ICD-10-CM | POA: Diagnosis not present

## 2021-02-03 DIAGNOSIS — Z951 Presence of aortocoronary bypass graft: Secondary | ICD-10-CM | POA: Diagnosis not present

## 2021-02-17 ENCOUNTER — Ambulatory Visit (INDEPENDENT_AMBULATORY_CARE_PROVIDER_SITE_OTHER): Payer: BC Managed Care – PPO | Admitting: Physician Assistant

## 2021-02-17 ENCOUNTER — Other Ambulatory Visit: Payer: Self-pay

## 2021-02-17 ENCOUNTER — Encounter: Payer: Self-pay | Admitting: Physician Assistant

## 2021-02-17 VITALS — BP 127/69 | HR 98 | Ht 67.0 in | Wt 209.0 lb

## 2021-02-17 DIAGNOSIS — E66811 Obesity, class 1: Secondary | ICD-10-CM

## 2021-02-17 DIAGNOSIS — E1340 Other specified diabetes mellitus with diabetic neuropathy, unspecified: Secondary | ICD-10-CM | POA: Diagnosis not present

## 2021-02-17 DIAGNOSIS — F411 Generalized anxiety disorder: Secondary | ICD-10-CM | POA: Diagnosis not present

## 2021-02-17 DIAGNOSIS — R310 Gross hematuria: Secondary | ICD-10-CM | POA: Diagnosis not present

## 2021-02-17 DIAGNOSIS — E119 Type 2 diabetes mellitus without complications: Secondary | ICD-10-CM | POA: Diagnosis not present

## 2021-02-17 DIAGNOSIS — F17200 Nicotine dependence, unspecified, uncomplicated: Secondary | ICD-10-CM

## 2021-02-17 DIAGNOSIS — R311 Benign essential microscopic hematuria: Secondary | ICD-10-CM

## 2021-02-17 DIAGNOSIS — I25810 Atherosclerosis of coronary artery bypass graft(s) without angina pectoris: Secondary | ICD-10-CM

## 2021-02-17 DIAGNOSIS — Z6832 Body mass index (BMI) 32.0-32.9, adult: Secondary | ICD-10-CM

## 2021-02-17 DIAGNOSIS — F4310 Post-traumatic stress disorder, unspecified: Secondary | ICD-10-CM

## 2021-02-17 DIAGNOSIS — I214 Non-ST elevation (NSTEMI) myocardial infarction: Secondary | ICD-10-CM

## 2021-02-17 DIAGNOSIS — K2100 Gastro-esophageal reflux disease with esophagitis, without bleeding: Secondary | ICD-10-CM

## 2021-02-17 DIAGNOSIS — R4589 Other symptoms and signs involving emotional state: Secondary | ICD-10-CM

## 2021-02-17 DIAGNOSIS — E6609 Other obesity due to excess calories: Secondary | ICD-10-CM

## 2021-02-17 LAB — POCT GLYCOSYLATED HEMOGLOBIN (HGB A1C): Hemoglobin A1C: 8.7 % — AB (ref 4.0–5.6)

## 2021-02-17 LAB — POCT URINALYSIS DIP (CLINITEK)
Bilirubin, UA: NEGATIVE
Glucose, UA: 500 mg/dL — AB
Ketones, POC UA: NEGATIVE mg/dL
Nitrite, UA: NEGATIVE
POC PROTEIN,UA: NEGATIVE
Spec Grav, UA: 1.025 (ref 1.010–1.025)
Urobilinogen, UA: 0.2 E.U./dL
pH, UA: 5.5 (ref 5.0–8.0)

## 2021-02-17 MED ORDER — TOUJEO MAX SOLOSTAR 300 UNIT/ML ~~LOC~~ SOPN: 20.0000 [IU] | PEN_INJECTOR | Freq: Every day | SUBCUTANEOUS | 0 refills | Status: DC

## 2021-02-17 MED ORDER — NICOTINE 14 MG/24HR TD PT24: 14.0000 mg | MEDICATED_PATCH | Freq: Every day | TRANSDERMAL | 1 refills | Status: DC

## 2021-02-17 NOTE — Progress Notes (Signed)
Please culture.

## 2021-02-17 NOTE — Progress Notes (Signed)
Subjective:    Patient ID: Denise Tyler, female    DOB: 03-23-1966, 55 y.o.   MRN: [id number]  HPI  Patient is a 55 year old obese female with type 2 diabetes, GERD, hyperlipidemia, CAD, recent history of NSTEMI and CABG x3 who presents to the clinic for follow-up.  Patient presented to the ED on 314 with nausea vomiting and chest pain.  She had elevated troponins and heart cath showed blockages.  Patient got very scared and left AMA from the hospital that day.  My office called her and urged her to go back to the ED to have this taken care of.  She went back to the ED the same night 55/14/2022.  On 6 patient had a CABG x3.  Was a very traumatic experience for her.  It was very hard for her to be seen echoed by so many people.  She felt very scared and a knowledgeable about what was going on.  She is still finds herself very tearful about everything that is happening.  She also feels like she has let me down.  She remembers our conversations and not listening to my advice.  She comes in today wanting to be better and get healthy.  She is currently not taking anything for her diabetes.  She wants to start from a clean [place] and have discussions about these medications.  She had a follow-up with her cardiothoracic surgeon and was released.  She will see cardiology in next few weeks.   She is not having any chest pain, shortness of breath, headache, vision changes.  She is having some leg soreness of where the vein was taken from her leg.  No significant swelling or symptoms.  She has noticed little bit of blood when she urinates.  She was In the hospital.  Denies any urinary symptoms such as dysuria or increase in frequency.  She is cutting back on smoking.  She is taking a few puffs of cigarettes daily.  She really wants to quit.   .. Active Ambulatory Problems    Diagnosis Date Noted  . ANKLE PAIN 02/02/2011  . GERD (gastroesophageal reflux disease) 11/08/2013  . Epigastric pain  11/08/2013  . Uncontrolled diabetes mellitus ([place]) 11/08/2013  . Hepatic steatosis 11/27/2014  . Adenoma of left adrenal gland 11/27/2014  . Constipation 11/27/2014  . Type 2 diabetes mellitus with hyperglycemia, with long-term current use of insulin ([place]) 01/27/2016  . Onychomycosis 01/27/2016  . CVA tenderness 03/30/2016  . GAD (generalized anxiety disorder) 12/25/2016  . Insomnia 12/25/2016  . Multiple joint pain 12/25/2016  . Hypertriglyceridemia 01/01/2017  . Right hip pain 07/11/2017  . Hyperlipidemia associated with type 2 diabetes mellitus ([place]) 07/13/2017  . Primary osteoarthritis of right hip 07/13/2017  . Trochanteric bursitis of right hip 01/06/2018  . Right knee pain 01/06/2018  . No energy 01/30/2018  . Chronic pain of both knees 01/30/2018  . Tobacco abuse 12/05/2018  . Tobacco dependence 12/25/2019  . Right upper quadrant abdominal pain 01/30/2020  . Depression, major, single episode, mild ([place]) 02/06/2020  . Degenerative arthritis of thumb, left 11/05/2020  . Degenerative arthritis of thumb, right 11/05/2020  . 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 Non compliance w medication regimen 11/05/2020  . Bilateral thumb pain 11/05/2020  . Anxiety state 11/05/2020  . 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-sided sciatica 11/05/2020  . Coronary artery disease involving coronary bypass graft of native heart without angina pectoris 02/19/2021  . Neuropathy due to secondary diabetes (  [place]) 02/19/2021  . Gross hematuria 02/19/2021  . Diabetes mellitus with hemoglobin A1c goal of 7.0%-8.0% ([place]) 02/19/2021  . Depressed mood 02/19/2021  . NSTEMI (non-ST elevated myocardial infarction) ([place]) 02/19/2021  . Class 1 obesity due to excess calories with serious comorbidity and body mass index (BMI) of 32.0 to 32.9 in adult 02/19/2021  . PTSD (post-traumatic stress disorder) 02/19/2021   Resolved Ambulatory Problems    Diagnosis Date Noted  . Black tarry  stools 03/30/2016   Past Medical History:  Diagnosis Date  . Diabetes mellitus without complication ([place])   . Mini stroke [facility])          Review of Systems See HPI.     Objective:   Physical Exam Vitals reviewed.  Constitutional:      Appearance: Normal appearance. [name].  Cardiovascular:     Rate and Rhythm: Normal rate and regular rhythm.     Pulses: Normal pulses.     Heart sounds: Normal heart sounds.  Pulmonary:     Effort: Pulmonary effort is normal.     Breath sounds: Normal breath sounds.  Musculoskeletal:     Right lower leg: No edema.     Left lower leg: No edema.  Skin:    Comments: Well healed incision over sternum.   Neurological:     General: No focal deficit present.     Mental Status: She is alert and oriented to person, place, and time.  Psychiatric:     Comments: Tearful and emotional       .. Results for orders placed or performed in visit on 02/17/21  Urine Culture   Specimen: Urine  Result Value Ref Range   MICRO NUMBER: [id number]    SPECIMEN QUALITY: Adequate    Sample Source NOT GIVEN    STATUS: FINAL    ISOLATE 1:      Mixed genital flora isolated. These superficial bacteria are not indicative of a urinary tract infection. No further organism identification is warranted on this specimen. If clinically indicated, recollect clean-catch, mid-stream urine and transfer  immediately to Urine Culture Transport Tube.   POCT glycosylated hemoglobin (Hb A1C)  Result Value Ref Range   Hemoglobin A1C 8.7 (A) 4.0 - 5.6 %   HbA1c POC (<> result, manual entry)     HbA1c, POC (prediabetic range)     HbA1c, POC (controlled diabetic range)    POCT URINALYSIS DIP (CLINITEK)  Result Value Ref Range   Color, UA yellow yellow   Clarity, UA clear clear   Glucose, UA =500 (A) negative mg/dL   Bilirubin, UA negative negative   Ketones, POC UA negative negative mg/dL   Spec Grav, UA 1.025 1.010 - 1.025   Blood, UA trace-lysed (A) negative    pH, UA 5.5 5.0 - 8.0   POC PROTEIN,UA negative negative, trace   Urobilinogen, UA 0.2 0.2 or 1.0 E.U./dL   Nitrite, UA Negative Negative   Leukocytes, UA Trace (A) Negative       Assessment & Plan:  [name] KitchenMarland KitchenPearly was seen today for follow-up and diabetes.  Diagnoses and all orders for this visit:  Diabetes mellitus with hemoglobin A1c goal of 7.0%-8.0% (HCC) -     POCT glycosylated hemoglobin (Hb A1C)  Coronary artery disease involving coronary bypass graft of native heart without angina pectoris -     Ambulatory referral to Psychology  Neuropathy due to secondary diabetes ([place]) -     insulin glargine, 2 Unit Dial, (TOUJEO MAX SOLOSTAR) 300 UNIT/ML  Solostar Pen; Inject 20 Units into the skin at bedtime.  Anxiety state -     Ambulatory referral to Psychology  Gross hematuria -     POCT URINALYSIS DIP (CLINITEK) -     Urine Culture  Tobacco dependence -     nicotine (NICODERM CQ - DOSED IN MG/24 HOURS) 14 mg/24hr patch; Place 1 patch (14 mg total) onto the skin daily.  Gastroesophageal reflux disease with esophagitis, unspecified whether hemorrhage  GAD (generalized anxiety disorder) -     Ambulatory referral to Psychology  Depressed mood -     Ambulatory referral to Psychology  NSTEMI (non-ST elevated myocardial infarction) ([place]) -     Ambulatory referral to Psychology  Class 1 obesity due to excess calories with serious comorbidity and body mass index (BMI) of 32.0 to 32.9 in adult  PTSD (post-traumatic stress disorder) -     Ambulatory referral to Psychology  A1C not to goal at 8.7.  Pt has freestyle meter to keep closer eye on sugars.  Restart metformin. Restart trulicity.  Restart toujeo 20 units at bedtime. Goal fasting under 130.  Discussed side effects.  Continue to make diet changes low sugar/carb.  Start walking regularly.  Not on ACE/ARB. BP to goal.  On lipitor. On ASA. Declined covid and pneumonia vaccine.  Needs eye and foot exam.   ..Discussed low  carb diet with 1500 calories and 80g of protein.  Exercising at least 150 minutes a week.  [employer] could be a Microbiologist.    Needs colonoscopy. Wants to hold off on right now.   NSTEMI/CAD- continue follow up with cardiology.  On plavix/ASA/lipitor/metoprolol.  BP to goal.   Pt is very emotional about the all the events that took place and being exposed to so many people. She finds herself tearful and emotional. She wonders "when we it go away". Referral for counseling. Offered medication declined today. Ok to take ativan as needed since done with oxycodone. Use sparingly.    Gross blood in urine. Likely from cath. Will get UA and culture today.   Continue to work on smoking cessation. Given patches. Stop smoking over the next 2 weeks then wait one month on 21mg  before dropping to 14mg  and then 7mg  then stop.   Follow up in 4 weeks.  Spent 40 minutes with patient reviewing chart, reassuring, discussing treatment plan encouraging next steps to avoid other CV events.

## 2021-02-17 NOTE — Patient Instructions (Addendum)
Restart metformin and trulicity and toujeo.  Will refer to counseling.

## 2021-02-19 ENCOUNTER — Encounter: Payer: Self-pay | Admitting: Physician Assistant

## 2021-02-19 DIAGNOSIS — R310 Gross hematuria: Secondary | ICD-10-CM | POA: Insufficient documentation

## 2021-02-19 DIAGNOSIS — F4310 Post-traumatic stress disorder, unspecified: Secondary | ICD-10-CM | POA: Insufficient documentation

## 2021-02-19 DIAGNOSIS — I214 Non-ST elevation (NSTEMI) myocardial infarction: Secondary | ICD-10-CM | POA: Insufficient documentation

## 2021-02-19 DIAGNOSIS — E119 Type 2 diabetes mellitus without complications: Secondary | ICD-10-CM | POA: Insufficient documentation

## 2021-02-19 DIAGNOSIS — E6609 Other obesity due to excess calories: Secondary | ICD-10-CM | POA: Insufficient documentation

## 2021-02-19 DIAGNOSIS — I25810 Atherosclerosis of coronary artery bypass graft(s) without angina pectoris: Secondary | ICD-10-CM | POA: Insufficient documentation

## 2021-02-19 DIAGNOSIS — R4589 Other symptoms and signs involving emotional state: Secondary | ICD-10-CM | POA: Insufficient documentation

## 2021-02-19 DIAGNOSIS — E1340 Other specified diabetes mellitus with diabetic neuropathy, unspecified: Secondary | ICD-10-CM | POA: Insufficient documentation

## 2021-02-19 LAB — URINE CULTURE
MICRO NUMBER:: 11812931
SPECIMEN QUALITY:: ADEQUATE

## 2021-02-19 NOTE — Progress Notes (Signed)
No significant bacteria in urine culture found.

## 2021-03-17 ENCOUNTER — Ambulatory Visit (INDEPENDENT_AMBULATORY_CARE_PROVIDER_SITE_OTHER): Payer: BC Managed Care – PPO | Admitting: Physician Assistant

## 2021-03-17 DIAGNOSIS — Z5329 Procedure and treatment not carried out because of patient's decision for other reasons: Secondary | ICD-10-CM

## 2021-03-17 NOTE — Progress Notes (Signed)
No show

## 2021-04-07 DIAGNOSIS — I252 Old myocardial infarction: Secondary | ICD-10-CM | POA: Diagnosis not present

## 2021-04-07 DIAGNOSIS — I2510 Atherosclerotic heart disease of native coronary artery without angina pectoris: Secondary | ICD-10-CM | POA: Diagnosis not present

## 2021-04-07 DIAGNOSIS — Z720 Tobacco use: Secondary | ICD-10-CM | POA: Diagnosis not present

## 2021-04-07 DIAGNOSIS — E119 Type 2 diabetes mellitus without complications: Secondary | ICD-10-CM | POA: Diagnosis not present

## 2021-04-07 DIAGNOSIS — Z951 Presence of aortocoronary bypass graft: Secondary | ICD-10-CM | POA: Diagnosis not present

## 2021-04-24 ENCOUNTER — Other Ambulatory Visit: Payer: Self-pay | Admitting: Physician Assistant

## 2021-04-24 DIAGNOSIS — F411 Generalized anxiety disorder: Secondary | ICD-10-CM

## 2021-04-25 NOTE — Telephone Encounter (Signed)
Last written 11/04/2020 #20 with 2 refills Last appt 02/17/2021

## 2021-06-16 ENCOUNTER — Emergency Department (INDEPENDENT_AMBULATORY_CARE_PROVIDER_SITE_OTHER)
Admission: EM | Admit: 2021-06-16 | Discharge: 2021-06-16 | Disposition: A | Discharge status: Home / Self Care | Payer: BC Managed Care – PPO | Source: Home / Self Care

## 2021-06-16 ENCOUNTER — Emergency Department (INDEPENDENT_AMBULATORY_CARE_PROVIDER_SITE_OTHER): Payer: BC Managed Care – PPO

## 2021-06-16 ENCOUNTER — Other Ambulatory Visit: Payer: Self-pay

## 2021-06-16 DIAGNOSIS — R079 Chest pain, unspecified: Secondary | ICD-10-CM

## 2021-06-16 DIAGNOSIS — R0789 Other chest pain: Secondary | ICD-10-CM | POA: Diagnosis not present

## 2021-06-16 NOTE — Discharge Instructions (Addendum)
Advised/instructed patient to use OTC Aleve or Advil sparingly for chest wall pain.

## 2021-06-16 NOTE — ED Provider Notes (Signed)
[facility]    CSN: [id number] Arrival date & time: 06/16/21  0903      History   Chief Complaint Chief Complaint  Patient presents with   Chest Pain    HPI Denise Tyler is a 55 y.o. female.   HPI 55 year old presents with chest pain in the middle of night.  Reports CABG in March of this year.  Past Medical History:  Diagnosis Date   Diabetes mellitus without complication ([place])    Mini stroke [facility])     Patient Active Problem List   Diagnosis Date Noted   Coronary artery disease involving coronary bypass graft of native heart without angina pectoris 02/19/2021   Neuropathy due to secondary diabetes ([place]) 02/19/2021   Gross hematuria 02/19/2021   Diabetes mellitus with hemoglobin A1c goal of 7.0%-8.0% ([place]) 02/19/2021   Depressed mood 02/19/2021   NSTEMI (non-ST elevated myocardial infarction) ([place]) 02/19/2021   Class 1 obesity due to excess calories with serious comorbidity and body mass index (BMI) of 32.0 to 32.9 in adult 02/19/2021   PTSD (post-traumatic stress disorder) 02/19/2021   Degenerative arthritis of thumb, left 11/05/2020   Degenerative arthritis of thumb, right 11/05/2020   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[place]) 11/05/2020   Non compliance w medication regimen 11/05/2020   Bilateral thumb pain 11/05/2020   Anxiety state 11/05/2020   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-sided sciatica 11/05/2020   Depression, major, single episode, mild ([place]) 02/06/2020   Right upper quadrant abdominal pain 01/30/2020   Tobacco dependence 12/25/2019   Tobacco abuse 12/05/2018   No energy 01/30/2018   Chronic pain of both knees 01/30/2018   Trochanteric bursitis of right hip 01/06/2018   Right knee pain 01/06/2018   Hyperlipidemia associated with type 2 diabetes mellitus ([place]) 07/13/2017   Primary osteoarthritis of right hip 07/13/2017   Right hip pain 07/11/2017   Hypertriglyceridemia 01/01/2017   GAD (generalized anxiety disorder) 12/25/2016    Insomnia 12/25/2016   Multiple joint pain 12/25/2016   CVA tenderness 03/30/2016   Type 2 diabetes mellitus with hyperglycemia, with long-term current use of insulin () 01/27/2016   Onychomycosis 01/27/2016   Hepatic steatosis 11/27/2014   Adenoma of left adrenal gland 11/27/2014   Constipation 11/27/2014   GERD (gastroesophageal reflux disease) 11/08/2013   Epigastric pain 11/08/2013   Uncontrolled diabetes mellitus ([place]) 11/08/2013   ANKLE PAIN 02/02/2011    Past Surgical History:  Procedure Laterality Date   ABDOMINAL HYSTERECTOMY     BLADDER SURGERY     CHOLECYSTECTOMY      OB History   No obstetric history on file.      Home Medications    Prior to Admission medications   Medication Sig Start Date End Date Taking? Authorizing Provider  albuterol (PROVENTIL HFA;VENTOLIN HFA) 108 (90 Base) MCG/ACT inhaler Inhale 2 puffs into the lungs every 6 (six) hours as needed for wheezing or shortness of breath. Patient not taking: No sig reported 10/28/18   Donella Stade, PA-C  aspirin 81 MG EC tablet Take 81 mg by mouth daily. 01/15/21 01/15/22  [provider]  atorvastatin (LIPITOR) 40 MG tablet Take 40 mg by mouth daily. 01/15/21   [provider]  clopidogrel (PLAVIX) 75 MG tablet Take 1 tablet by mouth daily. 01/15/21   [provider]  Continuous Blood Gluc Receiver (FREESTYLE LIBRE 14 DAY READER) DEVI 1 applicator by Does not apply route every 14 (fourteen) days. 11/04/20   Breeback, [name] L, PA-C  Continuous Blood Gluc  Sensor (FREESTYLE LIBRE 14 DAY SENSOR) MISC 1 Device by Does not apply route every 14 (fourteen) days. 11/04/20   Breeback, Jade L, PA-C  Dulaglutide (TRULICITY) [id number] SOPN Inject 0.75 mg into the skin once a week. Patient not taking: Reported on 02/17/2021 11/04/20   Donella Stade, PA-C  famotidine (PEPCID) 20 MG tablet Take 20 mg by mouth in the morning and at bedtime. 01/15/21   [provider]  insulin glargine,  2 Unit Dial, (TOUJEO MAX SOLOSTAR) 300 UNIT/ML Solostar Pen Inject 20 Units into the skin at bedtime. 02/17/21   Breeback, Jade L, PA-C  LORazepam (ATIVAN) 0.5 MG tablet TAKE ONE TABLET BY MOUTH EVERY 8 HOURS AS NEEDED FOR ANXIETY 04/25/21   Breeback, Jade L, PA-C  metFORMIN (GLUCOPHAGE XR) 500 MG 24 hr tablet Take 2 tablets (1,000 mg total) by mouth daily with breakfast. Patient not taking: Reported on 02/17/2021 11/04/20 11/04/21  Iran Planas L, PA-C  metoprolol tartrate (LOPRESSOR) 25 MG tablet Take 25 mg by mouth in the morning and at bedtime. 01/15/21   [provider]    Family History Family History  Problem Relation Age of Onset   Heart attack Mother    Hypertension Mother    Cancer Other    Stroke Other    Cancer Maternal Aunt    Stroke Maternal Uncle    Stroke Maternal Grandfather     Social History Social History   Tobacco Use   Smoking status: Every Day    Packs/day: 1.00    Types: Cigarettes   Smokeless tobacco: Never  Vaping Use   Vaping Use: Never used  Substance Use Topics   Alcohol use: Yes   Drug use: No     Allergies   Metformin and related and Xigduo xr [dapagliflozin-metformin hcl er]   Review of Systems Review of Systems  Cardiovascular:  Positive for chest pain.  All other systems reviewed and are negative.   Physical Exam Triage Vital Signs ED Triage Vitals  Enc Vitals Group     BP 06/16/21 0924 119/84     Pulse Rate 06/16/21 0924 83     Resp 06/16/21 0924 18     Temp 06/16/21 0924 98.2 F (36.8 C)     Temp Source 06/16/21 0924 Oral     SpO2 06/16/21 0924 97 %     Weight 06/16/21 0925 202 lb (91.6 kg)     Height 06/16/21 0925 '5\' 7"'$  (1.702 m)     Head Circumference --      Peak Flow --      Pain Score 06/16/21 0925 5     Pain Loc --      Pain Edu? --      Excl. in [place]? --    No data found.  Updated Vital Signs BP 119/84 (BP Location: Right Arm)   Pulse 83   Temp 98.2 F (36.8 C) (Oral)   Resp 18   Ht '5\' 7"'$  (1.702 m)    Wt 202 lb (91.6 kg)   SpO2 97%   BMI 31.64 kg/m  Physical Exam Vitals and nursing note reviewed.  Constitutional:      General: She is not in acute distress.    Appearance: Normal appearance. [name]. She is not ill-appearing.  HENT:     Head: Normocephalic and atraumatic.     Right Ear: Tympanic membrane, ear canal and external ear normal.     Left Ear: Tympanic membrane, ear canal and external ear  normal.     Nose: Nose normal.     Mouth/Throat:     Mouth: Mucous membranes are moist.     Pharynx: Oropharynx is clear.  Eyes:     Extraocular Movements: Extraocular movements intact.     Conjunctiva/sclera: Conjunctivae normal.     Pupils: Pupils are equal, round, and reactive to light.  Neck:     Comments: No JVD, no bruit Cardiovascular:     Rate and Rhythm: Normal rate and regular rhythm.     Pulses: Normal pulses.     Heart sounds: Normal heart sounds. No murmur heard.   No friction rub. No gallop.  Pulmonary:     Effort: Pulmonary effort is normal.     Breath sounds: Normal breath sounds. No wheezing, rhonchi or rales.  Musculoskeletal:        General: Normal range of motion.     Cervical back: Normal range of motion and neck supple.  Skin:    General: Skin is warm and dry.  Neurological:     General: No focal deficit present.     Mental Status: She is alert and oriented to person, place, and time. Mental status is at baseline.  Psychiatric:        Mood and Affect: Mood normal.        Behavior: Behavior normal.        Thought Content: Thought content normal.     UC Treatments / Results  Labs (all labs ordered are listed, but only abnormal results are displayed) Labs Reviewed - No data to display  EKG   Radiology DG Chest 2 View  Result Date: 06/16/2021 CLINICAL DATA:  Right-sided chest pain. EXAM: CHEST - 2 VIEW COMPARISON:  Chest x-ray dated November 25, 2017. FINDINGS: The heart size and mediastinal contours are within normal limits. Prior CABG.  Normal pulmonary vascularity. No focal consolidation, pleural effusion, or pneumothorax. No acute osseous abnormality. IMPRESSION: 1. No acute cardiopulmonary disease. Electronically Signed   By: Titus Dubin M.D.   On: 06/16/2021 10:25    Procedures Procedures (including critical care time)  Medications Ordered in UC Medications - No data to display  Initial Impression / Assessment and Plan / UC Course  I have reviewed the triage vital signs and the nursing notes.  Pertinent labs & imaging results that were available during my care of the patient were reviewed by me and considered in my medical decision making (see chart for details).     MDM: 1.  Chest pain-EKG reveals NSR with possible left atrial enlargement, CXR reveals no acute cardiopulmonary disease; 2.  Atypical chest pain-Advised/instructed patient to use OTC Aleve or Advil sparingly for chest wall pain.  Patient discharged home, hemodynamically stable. Final Clinical Impressions(s) / UC Diagnoses   Final diagnoses:  Chest pain, unspecified type  Atypical chest pain     Discharge Instructions      Advised/instructed patient to use OTC Aleve or Advil sparingly for chest wall pain.     ED Prescriptions   None    PDMP not reviewed this encounter.   Eliezer Lofts, [place] 06/16/21 1053

## 2021-06-16 NOTE — ED Triage Notes (Signed)
RT side chest pain started in the middle of the night persisted through the night. Had open heart surgery in March

## 2021-06-25 ENCOUNTER — Encounter: Payer: Self-pay | Admitting: Family Medicine

## 2021-09-09 ENCOUNTER — Other Ambulatory Visit: Payer: Self-pay | Admitting: Physician Assistant

## 2021-09-09 DIAGNOSIS — F411 Generalized anxiety disorder: Secondary | ICD-10-CM

## 2021-09-09 MED ORDER — LORAZEPAM 0.5 MG PO TABS: 0.5000 mg | ORAL_TABLET | Freq: Three times a day (TID) | ORAL | 2 refills | Status: DC | PRN

## 2021-09-09 NOTE — Progress Notes (Signed)
Pt came in with husband.  ..PDMP reviewed during this encounter. Refilled ativan for once a day.

## 2021-09-30 ENCOUNTER — Ambulatory Visit: Payer: BC Managed Care – PPO | Admitting: Physician Assistant

## 2021-09-30 DIAGNOSIS — E1340 Other specified diabetes mellitus with diabetic neuropathy, unspecified: Secondary | ICD-10-CM

## 2021-09-30 DIAGNOSIS — E1165 Type 2 diabetes mellitus with hyperglycemia: Secondary | ICD-10-CM

## 2021-09-30 DIAGNOSIS — R4589 Other symptoms and signs involving emotional state: Secondary | ICD-10-CM

## 2021-09-30 DIAGNOSIS — F411 Generalized anxiety disorder: Secondary | ICD-10-CM

## 2021-10-03 ENCOUNTER — Ambulatory Visit (INDEPENDENT_AMBULATORY_CARE_PROVIDER_SITE_OTHER): Payer: BC Managed Care – PPO | Admitting: Physician Assistant

## 2021-10-03 ENCOUNTER — Other Ambulatory Visit: Payer: Self-pay

## 2021-10-03 VITALS — BP 138/71 | HR 80 | Ht 67.0 in | Wt 210.0 lb

## 2021-10-03 DIAGNOSIS — F419 Anxiety disorder, unspecified: Secondary | ICD-10-CM

## 2021-10-03 DIAGNOSIS — Z1211 Encounter for screening for malignant neoplasm of colon: Secondary | ICD-10-CM

## 2021-10-03 DIAGNOSIS — E1165 Type 2 diabetes mellitus with hyperglycemia: Secondary | ICD-10-CM

## 2021-10-03 DIAGNOSIS — Z122 Encounter for screening for malignant neoplasm of respiratory organs: Secondary | ICD-10-CM

## 2021-10-03 DIAGNOSIS — B353 Tinea pedis: Secondary | ICD-10-CM

## 2021-10-03 DIAGNOSIS — E119 Type 2 diabetes mellitus without complications: Secondary | ICD-10-CM

## 2021-10-03 DIAGNOSIS — Z794 Long term (current) use of insulin: Secondary | ICD-10-CM

## 2021-10-03 DIAGNOSIS — E1340 Other specified diabetes mellitus with diabetic neuropathy, unspecified: Secondary | ICD-10-CM

## 2021-10-03 DIAGNOSIS — F439 Reaction to severe stress, unspecified: Secondary | ICD-10-CM

## 2021-10-03 LAB — POCT GLYCOSYLATED HEMOGLOBIN (HGB A1C): Hemoglobin A1C: 8 % — AB (ref 4.0–5.6)

## 2021-10-03 MED ORDER — METFORMIN HCL ER 500 MG PO TB24: 1000.0000 mg | ORAL_TABLET | Freq: Every day | ORAL | 5 refills | Status: DC

## 2021-10-03 MED ORDER — TRULICITY 0.75 MG/0.5ML ~~LOC~~ SOAJ: 0.7500 mg | SUBCUTANEOUS | 0 refills | Status: DC

## 2021-10-03 MED ORDER — BUSPIRONE HCL 7.5 MG PO TABS: 7.5000 mg | ORAL_TABLET | Freq: Two times a day (BID) | ORAL | 2 refills | Status: DC

## 2021-10-03 MED ORDER — CLOTRIMAZOLE-BETAMETHASONE 1-0.05 % EX CREA: 1.0000 "application " | TOPICAL_CREAM | Freq: Two times a day (BID) | CUTANEOUS | 1 refills | Status: DC

## 2021-10-03 NOTE — Patient Instructions (Addendum)
Ashwaganda  Lavendar Capsules(calm aid)  Buspar twice a day.   Generalized Anxiety Disorder, Adult Generalized anxiety disorder (GAD) is a mental health condition. Unlike normal worries, anxiety related to GAD is not triggered by a specific event. These worries do not fade or get better with time. GAD interferes with relationships, work, and school. GAD symptoms can vary from mild to severe. People with severe GAD can have intense waves of anxiety with physical symptoms that are similar to panic attacks. What are the causes? The exact cause of GAD is not known, but the following are believed to have an impact: Differences in natural brain chemicals. Genes passed down from parents to children. Differences in the way threats are perceived. Development and stress during childhood. Personality. What increases the risk? The following factors may make you more likely to develop this condition: Being female. Having a family history of anxiety disorders. Being very shy. Experiencing very stressful life events, such as the death of a loved one. Having a very stressful family environment. What are the signs or symptoms? People with GAD often worry excessively about many things in their lives, such as their health and family. Symptoms may also include: Mental and emotional symptoms: Worrying excessively about natural disasters. Fear of being late. Difficulty concentrating. Fears that others are judging your performance. Physical symptoms: Fatigue. Headaches, muscle tension, muscle twitches, trembling, or feeling shaky. Feeling like your heart is pounding or beating very fast. Feeling out of breath or like you cannot take a deep breath. Having trouble falling asleep or staying asleep, or experiencing restlessness. Sweating. Nausea, diarrhea, or irritable bowel syndrome (IBS). Behavioral symptoms: Experiencing erratic moods or irritability. Avoidance of new situations. Avoidance of  people. Extreme difficulty making decisions. How is this diagnosed? This condition is diagnosed based on your symptoms and medical history. You will also have a physical exam. Your health care provider may perform tests to rule out other possible causes of your symptoms. To be diagnosed with GAD, a person must have anxiety that: Is out of his or her control. Affects several different aspects of his or her life, such as work and relationships. Causes distress that makes him or her unable to take part in normal activities. Includes at least three symptoms of GAD, such as restlessness, fatigue, trouble concentrating, irritability, muscle tension, or sleep problems. Before your health care provider can confirm a diagnosis of GAD, these symptoms must be present more days than they are not, and they must last for 6 months or longer. How is this treated? This condition may be treated with: Medicine. Antidepressant medicine is usually prescribed for long-term daily control. Anti-anxiety medicines may be added in severe cases, especially when panic attacks occur. Talk therapy (psychotherapy). Certain types of talk therapy can be helpful in treating GAD by providing support, education, and guidance. Options include: Cognitive behavioral therapy (CBT). People learn coping skills and self-calming techniques to ease their physical symptoms. They learn to identify unrealistic thoughts and behaviors and to replace them with more appropriate thoughts and behaviors. Acceptance and commitment therapy (ACT). This treatment teaches people how to be mindful as a way to cope with unwanted thoughts and feelings. Biofeedback. This process trains you to manage your body's response (physiological response) through breathing techniques and relaxation methods. You will work with a therapist while machines are used to monitor your physical symptoms. Stress management techniques. These include yoga, meditation, and exercise. A  mental health specialist can help determine which treatment is best for you. Some  people see improvement with one type of therapy. However, other people require a combination of therapies. Follow these instructions at home: Lifestyle Maintain a consistent routine and schedule. Anticipate stressful situations. Create a plan and allow extra time to work with your plan. Practice stress management or self-calming techniques that you have learned from your therapist or your health care provider. Exercise regularly and spend time outdoors. Eat a healthy diet that includes plenty of vegetables, fruits, whole grains, low-fat dairy products, and lean protein. Do not eat a lot of foods that are high in fat, added sugar, or salt (sodium). Drink plenty of water. Avoid alcohol. Alcohol can increase anxiety. Avoid caffeine and certain over-the-counter cold medicines. These may make you feel worse. Ask your pharmacist which medicines to avoid. General instructions Take over-the-counter and prescription medicines only as told by your health care provider. Understand that you are likely to have setbacks. Accept this and be kind to yourself as you persist to take better care of yourself. Anticipate stressful situations. Create a plan and allow extra time to work with your plan. Recognize and accept your accomplishments, even if you judge them as small. Spend time with people who care about you. Keep all follow-up visits. This is important. Where to find more information [place]: https://carter.com/ Substance Abuse and Mental Health Services: ktimeonline.com Contact a health care provider if: Your symptoms do not get better. Your symptoms get worse. You have signs of depression, such as: A persistently sad or irritable mood. Loss of enjoyment in activities that used to bring you joy. Change in weight or eating. Changes in sleeping habits. Get help right away if: You have thoughts  about hurting yourself or others. If you ever feel like you may hurt yourself or others, or have thoughts about taking your own life, get help right away. Go to your nearest emergency department or: Call your local emergency services (911 in the [place] Call a suicide crisis helpline, such as the [place] at 609-861-9029 or 988 in the [place]. This is open 24 hours a day in the [place] Text the Crisis Text Line at (337) 672-8027 (in the [place].). Summary Generalized anxiety disorder (GAD) is a mental health condition that involves worry that is not triggered by a specific event. People with GAD often worry excessively about many things in their lives, such as their health and family. GAD may cause symptoms such as restlessness, trouble concentrating, sleep problems, frequent sweating, nausea, diarrhea, headaches, and trembling or muscle twitching. A mental health specialist can help determine which treatment is best for you. Some people see improvement with one type of therapy. However, other people require a combination of therapies. This information is not intended to replace advice given to you by your health care provider. Make sure you discuss any questions you have with your health care provider. Document Revised: 05/07/2021 Document Reviewed: 02/02/2021 Elsevier Patient Education  [place]. (

## 2021-10-03 NOTE — Progress Notes (Signed)
Subjective:    Patient ID: Denise Tyler, female    DOB: [birth year], 55 y.o.   MRN: [id number]  HPI Pt is a 55 yo obese female with T2DM, hx of MI, CAD, anxiety, MDD, current smoker who presents to the clinic for follow up and medication refill.   Pt is not checking her sugars. She has made lots of diet changes but admits she has not and does not want to start medication. No hypoglycemic events. No open sores or wounds.   Pt continues to smoke. She is trying to cut back.   Her anxiety and irritablity is really bad. She tried SSRI before and she does not like the way she feels on them. She uses ativan but she knows she is not supposed to take multiple times a day.   Itchy rash on feet for a few weeks. Not done anything to make better except moisture.    .. Active Ambulatory Problems    Diagnosis Date Noted   ANKLE PAIN 02/02/2011   GERD (gastroesophageal reflux disease) 11/08/2013   Epigastric pain 11/08/2013   Uncontrolled diabetes mellitus ([place]) 11/08/2013   Hepatic steatosis 11/27/2014   Adenoma of left adrenal gland 11/27/2014   Constipation 11/27/2014   Type 2 diabetes mellitus with hyperglycemia, with long-term current use of insulin ([place]) 01/27/2016   Onychomycosis 01/27/2016   CVA tenderness 03/30/2016   GAD (generalized anxiety disorder) 12/25/2016   Insomnia 12/25/2016   Multiple joint pain 12/25/2016   Hypertriglyceridemia 01/01/2017   Right hip pain 07/11/2017   Hyperlipidemia associated with type 2 diabetes mellitus ([place]) 07/13/2017   Primary osteoarthritis of right hip 07/13/2017   Trochanteric bursitis of right hip 01/06/2018   Right knee pain 01/06/2018   No energy 01/30/2018   Chronic pain of both knees 01/30/2018   Tobacco abuse 12/05/2018   Tobacco dependence 12/25/2019   Right upper quadrant abdominal pain 01/30/2020   Depression, major, single episode, mild ([place]) 02/06/2020   Degenerative arthritis of thumb, left 11/05/2020   Degenerative arthritis of  thumb, right 11/05/2020   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) 11/05/2020   Non compliance w medication regimen 11/05/2020   Bilateral thumb pain 11/05/2020   Anxiety state 11/05/2020   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-sided sciatica 11/05/2020   Coronary artery disease involving coronary bypass graft of native heart without angina pectoris 02/19/2021   Neuropathy due to secondary diabetes ([place]) 02/19/2021   Gross hematuria 02/19/2021   Diabetes mellitus with hemoglobin A1c goal of 7.0%-8.0% ([place]) 02/19/2021   Depressed mood 02/19/2021   NSTEMI (non-ST elevated myocardial infarction) ([employer]) 02/19/2021   Class 1 obesity due to excess calories with serious comorbidity and body mass index (BMI) of 32.0 to 32.9 in adult 02/19/2021   PTSD (post-traumatic stress disorder) 02/19/2021   Stress 10/03/2021   Resolved Ambulatory Problems    Diagnosis Date Noted   Black tarry stools 03/30/2016   Past Medical History:  Diagnosis Date   Diabetes mellitus without complication ([place])    Mini stroke ([place])     Review of Systems     Objective:   Physical Exam Vitals reviewed.  Constitutional:      Appearance: Normal appearance. [name].  HENT:     Head: Normocephalic.  Cardiovascular:     Rate and Rhythm: Normal rate and regular rhythm.     Pulses: Normal pulses.     Heart sounds: Normal heart sounds.  Pulmonary:     Effort: Pulmonary effort is normal.  Musculoskeletal:     Right lower leg: No edema.     Left lower leg: No edema.  Skin:    Comments: Bilateral scaly, dry, erythematous plantar surface of feet, bilaterally.   Neurological:     General: No focal deficit present.     Mental Status: She is alert and oriented to person, place, and time.  Psychiatric:        Mood and Affect: Mood normal.   .. Depression screen [facility] 2/9 10/03/2021 01/30/2020 12/25/2019 07/07/2017 12/25/2016  Decreased Interest 0 0 0 1 1  Down, Depressed, Hopeless 0 0 0 1 1  PHQ - 2 Score 0 0 0  2 2  Altered sleeping 2 2 3  - 3  Tired, decreased energy 1 2 2  - 3  Change in appetite 0 2 0 - 1  Feeling bad or failure about yourself  0 0 0 - 0  Trouble concentrating 0 1 0 - 1  Moving slowly or fidgety/restless 0 0 2 - 2  Suicidal thoughts 0 0 0 - 0  PHQ-9 Score 3 7 7  - 12  Difficult doing work/chores Not difficult at all Somewhat difficult Not difficult at all - -   .[name] Kitchen GAD 7 : Generalized Anxiety Score 10/03/2021 01/30/2020 12/25/2019 01/30/2018  Nervous, Anxious, on Edge 2 2 3 2   Control/stop worrying 2 2 3 2   Worry too much - different things 2 2 0 2  Trouble relaxing 2 2 3 2   Restless 1 1 0 2  Easily annoyed or irritable 1 2 0 2  Afraid - awful might happen 2 2 2 2   Total GAD 7 Score 12 13 11 14   Anxiety Difficulty Somewhat difficult Somewhat difficult Somewhat difficult Somewhat difficult        .[name] Kitchen Lab Results  Component Value Date   HGBA1C 8.0 (A) 10/03/2021       Assessment & Plan:  [name] KitchenMarland KitchenElke was seen today for follow-up.  Diagnoses and all orders for this visit:  Type 2 diabetes mellitus with hyperglycemia, with long-term current use of insulin (HCC) -     Dulaglutide (TRULICITY) 1.94 RD/4.0CX SOPN; Inject 0.75 mg into the skin once a week.  Diabetes mellitus with hemoglobin A1c goal of 7.0%-8.0% (HCC) -     POCT glycosylated hemoglobin (Hb A1C) -     Dulaglutide (TRULICITY) 4.48 JE/5.6DJ SOPN; Inject 0.75 mg into the skin once a week. -     metFORMIN (GLUCOPHAGE XR) 500 MG 24 hr tablet; Take 2 tablets (1,000 mg total) by mouth daily with breakfast.  Colon cancer screening -     Ambulatory referral to Gastroenterology  Neuropathy due to secondary diabetes ([place]) -     metFORMIN (GLUCOPHAGE XR) 500 MG 24 hr tablet; Take 2 tablets (1,000 mg total) by mouth daily with breakfast.  Stress -     busPIRone (BUSPAR) 7.5 MG tablet; Take 1 tablet (7.5 mg total) by mouth 2 (two) times daily.  Anxiety -     busPIRone (BUSPAR) 7.5 MG tablet; Take 1 tablet (7.5 mg total)  by mouth 2 (two) times daily.  Tinea pedis of both feet -     clotrimazole-betamethasone (LOTRISONE) cream; Apply 1 application topically 2 (two) times daily.  A1C is better than last check but not to goal Please start medication and discussed importance Bp close to goal On statin Foot exam showed fungus.start lortisone cream. Needs eye exam Needs colonoscopy ordered today.  Needs lung cancer screening due to smoking and age, ordered today.  GAD not to goal. Buspar to try follow up in 2 months.  Discussed managing stress Consider OtC ashwaganda and lavendar capsules.

## 2021-10-06 ENCOUNTER — Encounter: Payer: Self-pay | Admitting: Physician Assistant

## 2021-10-06 DIAGNOSIS — I2510 Atherosclerotic heart disease of native coronary artery without angina pectoris: Secondary | ICD-10-CM | POA: Diagnosis not present

## 2021-10-06 DIAGNOSIS — E119 Type 2 diabetes mellitus without complications: Secondary | ICD-10-CM | POA: Diagnosis not present

## 2021-10-06 DIAGNOSIS — I252 Old myocardial infarction: Secondary | ICD-10-CM | POA: Diagnosis not present

## 2021-10-06 DIAGNOSIS — Z951 Presence of aortocoronary bypass graft: Secondary | ICD-10-CM | POA: Diagnosis not present

## 2021-10-23 ENCOUNTER — Telehealth: Payer: Self-pay | Admitting: Neurology

## 2021-10-23 NOTE — Telephone Encounter (Signed)
Mychart message sent to patient.

## 2022-01-02 ENCOUNTER — Ambulatory Visit: Payer: BC Managed Care – PPO | Admitting: Physician Assistant

## 2022-01-09 ENCOUNTER — Ambulatory Visit: Payer: BC Managed Care – PPO | Admitting: Physician Assistant

## 2022-01-13 ENCOUNTER — Ambulatory Visit: Payer: Self-pay | Admitting: Physician Assistant

## 2022-01-13 DIAGNOSIS — Z1329 Encounter for screening for other suspected endocrine disorder: Secondary | ICD-10-CM

## 2022-01-13 DIAGNOSIS — E1165 Type 2 diabetes mellitus with hyperglycemia: Secondary | ICD-10-CM

## 2022-01-13 DIAGNOSIS — E1169 Type 2 diabetes mellitus with other specified complication: Secondary | ICD-10-CM

## 2022-01-13 DIAGNOSIS — E781 Pure hyperglyceridemia: Secondary | ICD-10-CM

## 2022-01-13 DIAGNOSIS — Z79899 Other long term (current) drug therapy: Secondary | ICD-10-CM

## 2022-03-27 ENCOUNTER — Ambulatory Visit (INDEPENDENT_AMBULATORY_CARE_PROVIDER_SITE_OTHER): Payer: No Typology Code available for payment source | Admitting: Physician Assistant

## 2022-03-27 ENCOUNTER — Encounter: Payer: Self-pay | Admitting: Physician Assistant

## 2022-03-27 VITALS — BP 121/74 | HR 90 | Ht 67.0 in | Wt 212.0 lb

## 2022-03-27 DIAGNOSIS — E781 Pure hyperglyceridemia: Secondary | ICD-10-CM | POA: Diagnosis not present

## 2022-03-27 DIAGNOSIS — Z794 Long term (current) use of insulin: Secondary | ICD-10-CM

## 2022-03-27 DIAGNOSIS — F411 Generalized anxiety disorder: Secondary | ICD-10-CM

## 2022-03-27 DIAGNOSIS — E785 Hyperlipidemia, unspecified: Secondary | ICD-10-CM

## 2022-03-27 DIAGNOSIS — E1169 Type 2 diabetes mellitus with other specified complication: Secondary | ICD-10-CM | POA: Diagnosis not present

## 2022-03-27 DIAGNOSIS — M5441 Lumbago with sciatica, right side: Secondary | ICD-10-CM

## 2022-03-27 DIAGNOSIS — E1165 Type 2 diabetes mellitus with hyperglycemia: Secondary | ICD-10-CM

## 2022-03-27 DIAGNOSIS — Z1329 Encounter for screening for other suspected endocrine disorder: Secondary | ICD-10-CM

## 2022-03-27 DIAGNOSIS — G8929 Other chronic pain: Secondary | ICD-10-CM

## 2022-03-27 DIAGNOSIS — Z79899 Other long term (current) drug therapy: Secondary | ICD-10-CM

## 2022-03-27 DIAGNOSIS — Z1211 Encounter for screening for malignant neoplasm of colon: Secondary | ICD-10-CM

## 2022-03-27 MED ORDER — CYCLOBENZAPRINE HCL 10 MG PO TABS: 10.0000 mg | ORAL_TABLET | Freq: Three times a day (TID) | ORAL | 1 refills | Status: DC | PRN

## 2022-03-27 MED ORDER — DICLOFENAC SODIUM 75 MG PO TBEC: 75.0000 mg | DELAYED_RELEASE_TABLET | Freq: Two times a day (BID) | ORAL | 2 refills | Status: DC

## 2022-03-27 NOTE — Progress Notes (Signed)
Established Patient Office Visit  Subjective   Patient ID: Denise Tyler, female    DOB: [birth year]  Age: 56 y.o. MRN: [id number]  Chief Complaint  Patient presents with   Follow-up    HPI Pt is a 56 yo obese female with T2DM, CAD, anxiety, chronic pain due to DDD who presents to the clinic for medication follow up.    Ongoing pain with right hip and left shoulder. Mobic did not help.   She request refill of ativan for anxiety to take as needed.   Pt admits she is not taking all her medications.  She is checking her sugars "some". They are in the 130 to 190s. No open sores or wounds. No hypoglycemic events. She is watching watch she eats and taking metformin. No CP, palpitations, headaches or vision changes.    Patient Active Problem List   Diagnosis Date Noted   Chronic left shoulder pain 04/01/2022   Stress 10/03/2021   Coronary artery disease involving coronary bypass graft of native heart without angina pectoris 02/19/2021   Neuropathy due to secondary diabetes ([place]) 02/19/2021   Gross hematuria 02/19/2021   Diabetes mellitus with hemoglobin A1c goal of 7.0%-8.0% ([place]) 02/19/2021   Depressed mood 02/19/2021   NSTEMI (non-ST elevated myocardial infarction) ([place]) 02/19/2021   Class 1 obesity due to excess calories with serious comorbidity and body mass index (BMI) of 32.0 to 32.9 in adult 02/19/2021   PTSD (post-traumatic stress disorder) 02/19/2021   Degenerative arthritis of thumb, left 11/05/2020   Degenerative arthritis of thumb, right 11/05/2020   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[place]) 11/05/2020   Non compliance w medication regimen 11/05/2020   Bilateral thumb pain 11/05/2020   Anxiety state 11/05/2020   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-sided sciatica 11/05/2020   Depression, major, single episode, mild ([place]) 02/06/2020   Right upper quadrant abdominal pain 01/30/2020   Tobacco dependence 12/25/2019   Tobacco abuse 12/05/2018   No energy 01/30/2018    Chronic pain of both knees 01/30/2018   Trochanteric bursitis of right hip 01/06/2018   Right knee pain 01/06/2018   Hyperlipidemia associated with type 2 diabetes mellitus ([place]) 07/13/2017   Primary osteoarthritis of right hip 07/13/2017   Right hip pain 07/11/2017   Hypertriglyceridemia 01/01/2017   GAD (generalized anxiety disorder) 12/25/2016   Insomnia 12/25/2016   Polyarthralgia 12/25/2016   CVA tenderness 03/30/2016   Type 2 diabetes mellitus with hyperglycemia, with long-term current use of insulin ([place]) 01/27/2016   Onychomycosis 01/27/2016   Hepatic steatosis 11/27/2014   Adenoma of left adrenal gland 11/27/2014   Constipation 11/27/2014   GERD (gastroesophageal reflux disease) 11/08/2013   Epigastric pain 11/08/2013   Uncontrolled diabetes mellitus ([place]) 11/08/2013   ANKLE PAIN 02/02/2011   Past Medical History:  Diagnosis Date   Diabetes mellitus without complication ([place])    Mini stroke    Family History  Problem Relation Age of Onset   Heart attack Mother    Hypertension Mother    Cancer Other    Stroke Other    Cancer Maternal Aunt    Stroke Maternal Uncle    Stroke Maternal Grandfather    Allergies  Allergen Reactions   Metformin And Related     diarrhea   Xigduo Xr [Dapagliflozin-Metformin Hcl Er]     Yeast infections      ROS   See HPI.  Objective:     BP 121/74   Pulse 90   Ht '5\' 7"'  (1.702 m)  Wt 212 lb (96.2 kg)   SpO2 99%   BMI 33.20 kg/m  BP Readings from Last 3 Encounters:  03/27/22 121/74  10/03/21 138/71  06/16/21 119/84   Wt Readings from Last 3 Encounters:  03/27/22 212 lb (96.2 kg)  10/03/21 210 lb (95.3 kg)  06/16/21 202 lb (91.6 kg)    .[name] Kitchen Results for orders placed or performed in visit on 03/27/22  TSH  Result Value Ref Range   TSH 2.32 mIU/L  Lipid Panel w/reflex Direct LDL  Result Value Ref Range   Cholesterol 210 (H) <200 mg/dL   HDL 33 (L) > OR = 50 mg/dL   Triglycerides 232 (H) <150 mg/dL   LDL  Cholesterol (Calc) 140 (H) mg/dL (calc)   Total CHOL/HDL Ratio 6.4 (H) <5.0 (calc)   Non-HDL Cholesterol (Calc) 177 (H) <130 mg/dL (calc)  COMPLETE METABOLIC PANEL WITH GFR  Result Value Ref Range   Glucose, Bld 209 (H) 65 - 99 mg/dL   BUN 10 7 - 25 mg/dL   Creat 0.77 0.50 - 1.03 mg/dL   eGFR 91 > OR = 60 mL/min/1.67m   BUN/Creatinine Ratio NOT APPLICABLE 6 - 22 (calc)   Sodium 138 135 - 146 mmol/L   Potassium 4.3 3.5 - 5.3 mmol/L   Chloride 105 98 - 110 mmol/L   CO2 28 20 - 32 mmol/L   Calcium 9.3 8.6 - 10.4 mg/dL   Total Protein 6.7 6.1 - 8.1 g/dL   Albumin 4.2 3.6 - 5.1 g/dL   Globulin 2.5 1.9 - 3.7 g/dL (calc)   AG Ratio 1.7 1.0 - 2.5 (calc)   Total Bilirubin 0.6 0.2 - 1.2 mg/dL   Alkaline phosphatase (APISO) 107 37 - 153 U/L   AST 11 10 - 35 U/L   ALT 15 6 - 29 U/L  CBC with Differential/Platelet  Result Value Ref Range   WBC 8.0 3.8 - 10.8 Thousand/uL   RBC 5.00 3.80 - 5.10 Million/uL   Hemoglobin 14.1 11.7 - 15.5 g/dL   HCT 42.3 35.0 - 45.0 %   MCV 84.6 80.0 - 100.0 fL   MCH 28.2 27.0 - 33.0 pg   MCHC 33.3 32.0 - 36.0 g/dL   RDW 13.2 11.0 - 15.0 %   Platelets 225 140 - 400 Thousand/uL   MPV 11.7 7.5 - 12.5 fL   Neutro Abs 5,528 1,500 - 7,800 cells/uL   Lymphs Abs 1,720 850 - 3,900 cells/uL   Absolute Monocytes 440 200 - 950 cells/uL   Eosinophils Absolute 240 15 - 500 cells/uL   Basophils Absolute 72 0 - 200 cells/uL   Neutrophils Relative % 69.1 %   Total Lymphocyte 21.5 %   Monocytes Relative 5.5 %   Eosinophils Relative 3.0 %   Basophils Relative 0.9 %  Hemoglobin A1c  Result Value Ref Range   Hgb A1c MFr Bld 7.8 (H) <5.7 % of total Hgb   Mean Plasma Glucose 177 mg/dL   eAG (mmol/L) 9.8 mmol/L   ..[name] Kitchen   10/03/2021    3:04 PM 01/30/2020    4:36 PM 12/25/2019    8:15 AM 07/07/2017   10:05 AM 12/25/2016    5:06 PM  Depression screen PHQ 2/9  Decreased Interest 0 0 0 1 1  Down, Depressed, Hopeless 0 0 0 1 1  PHQ - 2 Score 0 0 0 2 2  Altered sleeping '2 2 3   3  ' Tired, decreased energy '1 2 2  3  ' Change in appetite  0 2 0  1  Feeling bad or failure about yourself  0 0 0  0  Trouble concentrating 0 1 0  1  Moving slowly or fidgety/restless 0 0 2  2  Suicidal thoughts 0 0 0  0  PHQ-9 Score '3 7 7  12  ' Difficult doing work/chores Not difficult at all Somewhat difficult Not difficult at all     .[name] Kitchen    10/03/2021    3:05 PM 01/30/2020    4:37 PM 12/25/2019    8:12 AM 01/30/2018   10:19 PM  GAD 7 : Generalized Anxiety Score  Nervous, Anxious, on Edge '2 2 3 2  ' Control/stop worrying '2 2 3 2  ' Worry too much - different things 2 2 0 2  Trouble relaxing '2 2 3 2  ' Restless 1 1 0 2  Easily annoyed or irritable 1 2 0 2  Afraid - awful might happen '2 2 2 2  ' Total GAD 7 Score '12 13 11 14  ' Anxiety Difficulty Somewhat difficult Somewhat difficult Somewhat difficult Somewhat difficult      Physical Exam Vitals reviewed.  Constitutional:      Appearance: Normal appearance. [name].  HENT:     Head: Normocephalic.  Cardiovascular:     Rate and Rhythm: Normal rate and regular rhythm.     Pulses: Normal pulses.  Pulmonary:     Effort: Pulmonary effort is normal.     Breath sounds: Normal breath sounds.  Musculoskeletal:     Comments: Left shoulder with resistance and pain to ROM .   Neurological:     General: No focal deficit present.     Mental Status: She is alert and oriented to person, place, and time.  Psychiatric:        Mood and Affect: Mood normal.        Assessment & Plan:  [name] KitchenMarland KitchenKalecia was seen today for follow-up.  Diagnoses and all orders for this visit:  Type 2 diabetes mellitus with hyperglycemia, with long-term current use of insulin (HCC) -     COMPLETE METABOLIC PANEL WITH GFR -     Hemoglobin A1c  Hyperlipidemia associated with type 2 diabetes mellitus (HCC) -     Lipid Panel w/reflex Direct LDL  Hypertriglyceridemia -     Lipid Panel w/reflex Direct LDL  Medication management -     TSH -     Lipid Panel w/reflex  Direct LDL -     COMPLETE METABOLIC PANEL WITH GFR -     CBC with Differential/Platelet  Thyroid disorder screen -     TSH  Colon cancer screening -     Ambulatory referral to Gastroenterology  Anxiety state -     LORazepam (ATIVAN) 0.5 MG tablet; Take 1 tablet (0.5 mg total) by mouth every 8 (eight) hours as needed. for anxiety  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-sided sciatica -     diclofenac (VOLTAREN) 75 MG EC tablet; Take 1 tablet (75 mg total) by mouth 2 (two) times daily. -     cyclobenzaprine (FLEXERIL) 10 MG tablet; Take 1 tablet (10 mg total) by mouth 3 (three) times daily as needed for muscle spasms.   Colonoscopy ordered Fasting labs ordered Trial of diclofenac Concerned for adhesive capsulitis and sit bone pain Follow up with Dr. Darene Lamer for pain control  Ativan as needed for anxiety.   A1C better but not to goal START Trulicity Continue metformin  BP to goal On statin Needs eye  and foot exam Needs vaccines Follow up in 3 months   Return in about 3 months (around 06/27/2022).    Iran Planas, PA-C

## 2022-03-27 NOTE — Patient Instructions (Addendum)
Sit bone pain [facility]  Adhesive Capsulitis  Adhesive capsulitis, also called frozen shoulder, causes the shoulder to become stiff and painful to move. This condition happens when there is inflammation of the tendons and ligaments that surround the shoulder joint (shoulder capsule). What are the causes? This condition may be caused by: An injury to your shoulder joint. Straining your shoulder. Not moving your shoulder for a period of time. This can happen if your arm was injured or in a sling. Long-standing conditions, such as: Diabetes. Thyroid problems. Heart disease. Stroke. Rheumatoid arthritis. Lung disease. In some cases, the cause is not known. What increases the risk? You are more likely to develop this condition if you are: A woman. Older than 56 years of age. What are the signs or symptoms? Symptoms of this condition include: Pain in your shoulder when you move your arm. There may also be pain when parts of your shoulder are touched. The pain may be worse at night or when you are resting. A sore or aching shoulder. The inability to move your shoulder normally. Muscle spasms. How is this diagnosed? This condition is diagnosed with a physical exam and imaging tests, such as an X-ray or MRI. How is this treated? This condition may be treated with: Treatment of the underlying cause or condition. Medicine. Medicine may be given to relieve pain, inflammation, or muscle spasms. Steroid injections into the shoulder joint. Physical therapy. This involves performing exercises to get the shoulder moving again. Acupuncture. This is a type of treatment that involves stimulating specific points on your body by inserting thin needles through your skin. Shoulder manipulation. This is a procedure to move the shoulder into another position. It is done after you are given a medicine to make you fall asleep (general anesthetic). The joint may also be injected with salt  water at high pressure to break down scarring. Surgery. This may be done in severe cases when other treatments have failed. Although most people recover completely from adhesive capsulitis, some may not regain full shoulder movement. Follow these instructions at home: Managing pain, stiffness, and swelling     If directed, put ice on the injured area: Put ice in a plastic bag. Place a towel between your skin and the bag. Leave the ice on for 20 minutes, 2-3 times per day. If directed, apply heat to the affected area before you exercise. Use the heat source that your health care provider recommends, such as a moist heat pack or a heating pad. Place a towel between your skin and the heat source. Leave the heat on for 20-30 minutes. Remove the heat if your skin turns bright red. This is especially important if you are unable to feel pain, heat, or cold. You may have a greater risk of getting burned. General instructions Take over-the-counter and prescription medicines only as told by your health care provider. If you are being treated with physical therapy, follow instructions from your physical therapist. Avoid exercises that put a lot of demand on your shoulder, such as throwing. These exercises can make pain worse. Keep all follow-up visits as told by your health care provider. This is important. Contact a health care provider if: You develop new symptoms. Your symptoms get worse. Summary Adhesive capsulitis, also called frozen shoulder, causes the shoulder to become stiff and painful to move. You are more likely to have this condition if you are a woman and over age 56. It is treated with physical therapy, medicines,  and sometimes surgery. This information is not intended to replace advice given to you by your health care provider. Make sure you discuss any questions you have with your health care provider. Document Revised: 06/09/2021 Document Reviewed: 06/09/2021 Elsevier Patient  Education  [place].

## 2022-04-01 ENCOUNTER — Ambulatory Visit (INDEPENDENT_AMBULATORY_CARE_PROVIDER_SITE_OTHER): Payer: No Typology Code available for payment source

## 2022-04-01 ENCOUNTER — Ambulatory Visit (INDEPENDENT_AMBULATORY_CARE_PROVIDER_SITE_OTHER): Payer: No Typology Code available for payment source | Admitting: Sports Medicine

## 2022-04-01 DIAGNOSIS — M2550 Pain in unspecified joint: Secondary | ICD-10-CM

## 2022-04-01 DIAGNOSIS — G8929 Other chronic pain: Secondary | ICD-10-CM | POA: Diagnosis not present

## 2022-04-01 DIAGNOSIS — M25512 Pain in left shoulder: Secondary | ICD-10-CM

## 2022-04-01 DIAGNOSIS — M5441 Lumbago with sciatica, right side: Secondary | ICD-10-CM | POA: Diagnosis not present

## 2022-04-01 MED ORDER — LORAZEPAM 0.5 MG PO TABS
0.5000 mg | ORAL_TABLET | Freq: Three times a day (TID) | ORAL | 2 refills | Status: DC | PRN
Start: 2022-04-01 — End: 2022-09-16

## 2022-04-01 MED ORDER — GABAPENTIN 300 MG PO CAPS: ORAL_CAPSULE | ORAL | 3 refills | Status: DC

## 2022-04-01 MED ORDER — PREDNISONE 50 MG PO TABS: ORAL_TABLET | ORAL | 0 refills | Status: DC

## 2022-04-01 NOTE — Progress Notes (Signed)
    Procedures performed today:    None.  Independent interpretation of notes and tests performed by another provider:   None.  Brief History, Exam, Impression, and Recommendations:    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-sided sciatica Denise Tyler is here with chronic axial right-sided low back pain with radiation down the right leg to the lateral lower leg. Explained Tyler that this was likely coming from the lumbar spine. Adding lumbar spine x-rays, 5 days of prednisone, after the prednisone she can restart Tyler Voltaren. Adding Neurontin in an up taper. Formal physical therapy. Return to see me in 6 weeks, MR for interventional planning if no better.  Chronic left shoulder pain Severe left shoulder pain localized over the deltoid, unable to abduct symmetrically with the contralateral side. Pain with impingement maneuvers and external rotation. Unclear etiology, though there is likely a combination of cuff dysfunction and glenohumeral osteoarthritis. Adding shoulder x-rays, formal physical therapy. Return to see me in 6 weeks.   We will consider injections if not better  Polyarthralgia Multiple joint aches and pains, chronically. Looks like Tyler PCP tried to do a rheumatoid work-up in the past. Recently had labs done so we will not obtain a new set of labs. If she has a very dramatic response to the prednisone we will consider pulling the trigger for an additional rheumatoid work-up.    ___________________________________________ Denise Tyler. Denise Tyler, M.D., ABFM., CAQSM. Primary Care and [place] Instructor of [employer] [facility] [employer]

## 2022-04-01 NOTE — Assessment & Plan Note (Signed)
Severe left shoulder pain localized over the deltoid, unable to abduct symmetrically with the contralateral side. Pain with impingement maneuvers and external rotation. Unclear etiology, though there is likely a combination of cuff dysfunction and glenohumeral osteoarthritis. Adding shoulder x-rays, formal physical therapy. Return to see me in 6 weeks.   We will consider injections if not better

## 2022-04-01 NOTE — Assessment & Plan Note (Signed)
Multiple joint aches and pains, chronically. Looks like her PCP tried to do a rheumatoid work-up in the past. Recently had labs done so we will not obtain a new set of labs. If she has a very dramatic response to the prednisone we will consider pulling the trigger for an additional rheumatoid work-up.

## 2022-04-01 NOTE — Assessment & Plan Note (Signed)
Denise Tyler is here with chronic axial right-sided low back pain with radiation down the right leg to the lateral lower leg. Explained her that this was likely coming from the lumbar spine. Adding lumbar spine x-rays, 5 days of prednisone, after the prednisone she can restart her Voltaren. Adding Neurontin in an up taper. Formal physical therapy. Return to see me in 6 weeks, MR for interventional planning if no better.

## 2022-04-02 LAB — COMPLETE METABOLIC PANEL WITH GFR
AG Ratio: 1.7 (calc) (ref 1.0–2.5)
ALT: 15 U/L (ref 6–29)
AST: 11 U/L (ref 10–35)
Albumin: 4.2 g/dL (ref 3.6–5.1)
Alkaline phosphatase (APISO): 107 U/L (ref 37–153)
BUN: 10 mg/dL (ref 7–25)
CO2: 28 mmol/L (ref 20–32)
Calcium: 9.3 mg/dL (ref 8.6–10.4)
Chloride: 105 mmol/L (ref 98–110)
Creat: 0.77 mg/dL (ref 0.50–1.03)
Globulin: 2.5 g/dL (calc) (ref 1.9–3.7)
Glucose, Bld: 209 mg/dL — ABNORMAL HIGH (ref 65–99)
Potassium: 4.3 mmol/L (ref 3.5–5.3)
Sodium: 138 mmol/L (ref 135–146)
Total Bilirubin: 0.6 mg/dL (ref 0.2–1.2)
Total Protein: 6.7 g/dL (ref 6.1–8.1)
eGFR: 91 mL/min/{1.73_m2} (ref >=60)

## 2022-04-02 LAB — CBC WITH DIFFERENTIAL/PLATELET
Absolute Monocytes: 440 cells/uL (ref 200–950)
Basophils Absolute: 72 cells/uL (ref 0–200)
Basophils Relative: 0.9 %
Eosinophils Absolute: 240 cells/uL (ref 15–500)
Eosinophils Relative: 3 %
HCT: 42.3 % (ref 35.0–45.0)
Hemoglobin: 14.1 g/dL (ref 11.7–15.5)
Lymphs Abs: 1720 cells/uL (ref 850–3900)
MCH: 28.2 pg (ref 27.0–33.0)
MCHC: 33.3 g/dL (ref 32.0–36.0)
MCV: 84.6 fL (ref 80.0–100.0)
MPV: 11.7 fL (ref 7.5–12.5)
Monocytes Relative: 5.5 %
Neutro Abs: 5528 cells/uL (ref 1500–7800)
Neutrophils Relative %: 69.1 %
Platelets: 225 10*3/uL (ref 140–400)
RBC: 5 10*6/uL (ref 3.80–5.10)
RDW: 13.2 % (ref 11.0–15.0)
Total Lymphocyte: 21.5 %
WBC: 8 10*3/uL (ref 3.8–10.8)

## 2022-04-02 LAB — HEMOGLOBIN A1C
Hgb A1c MFr Bld: 7.8 % of total Hgb — ABNORMAL HIGH (ref <5.7)
Mean Plasma Glucose: 177 mg/dL
eAG (mmol/L): 9.8 mmol/L

## 2022-04-02 LAB — LIPID PANEL W/REFLEX DIRECT LDL
Cholesterol: 210 mg/dL — ABNORMAL HIGH (ref <200)
HDL: 33 mg/dL — ABNORMAL LOW (ref >=50)
LDL Cholesterol (Calc): 140 mg/dL (calc) — ABNORMAL HIGH
Non-HDL Cholesterol (Calc): 177 mg/dL (calc) — ABNORMAL HIGH (ref <130)
Total CHOL/HDL Ratio: 6.4 (calc) — ABNORMAL HIGH (ref <5.0)
Triglycerides: 232 mg/dL — ABNORMAL HIGH (ref <150)

## 2022-04-02 LAB — TSH: TSH: 2.32 mIU/L

## 2022-04-02 NOTE — Progress Notes (Signed)
Thyroid looks great.  Hemoglobin looks good.  A1C is not to goal but a little better than 6 months ago.  START trulicity! Kidney and liver look good.  TG and LDL high, HDL low need to start statin(lipitor) daily and recheck in 6 months.

## 2022-04-03 ENCOUNTER — Encounter: Payer: Self-pay | Admitting: Physician Assistant

## 2022-04-09 ENCOUNTER — Ambulatory Visit: Payer: No Typology Code available for payment source | Admitting: Physical Therapy

## 2022-04-10 ENCOUNTER — Ambulatory Visit: Payer: No Typology Code available for payment source | Attending: Physician Assistant | Admitting: Physical Therapy

## 2022-04-10 ENCOUNTER — Encounter: Payer: Self-pay | Admitting: Physical Therapy

## 2022-04-10 DIAGNOSIS — M5459 Other low back pain: Secondary | ICD-10-CM | POA: Insufficient documentation

## 2022-04-10 DIAGNOSIS — G8929 Other chronic pain: Secondary | ICD-10-CM | POA: Diagnosis present

## 2022-04-10 DIAGNOSIS — M25512 Pain in left shoulder: Secondary | ICD-10-CM | POA: Insufficient documentation

## 2022-04-10 DIAGNOSIS — M6281 Muscle weakness (generalized): Secondary | ICD-10-CM | POA: Insufficient documentation

## 2022-04-10 DIAGNOSIS — R293 Abnormal posture: Secondary | ICD-10-CM | POA: Diagnosis present

## 2022-04-10 DIAGNOSIS — M5441 Lumbago with sciatica, right side: Secondary | ICD-10-CM | POA: Diagnosis not present

## 2022-04-10 NOTE — Therapy (Signed)
[place], [place], [address] Phone: [phone]   Fax:  [phone]  Physical Therapy Evaluation  Patient Details  Name: Denise Tyler MRN: [id number] Date of Birth: Jun 21, 1966 Referring Provider (PT): Thekkekandam   Encounter Date: 04/10/2022 Rationale for Evaluation and Treatment Rehabilitation   PT End of Session - 04/10/22 1152     Visit Number 1    Number of Visits 12    Date for PT Re-Evaluation 05/22/22    PT Start Time 9163    PT Stop Time 1100    PT Time Calculation (min) 45 min    Activity Tolerance Patient tolerated treatment well    Behavior During Therapy [facility] for tasks assessed/performed             Past Medical History:  Diagnosis Date   Diabetes mellitus without complication ([place])    Mini stroke     Past Surgical History:  Procedure Laterality Date   ABDOMINAL HYSTERECTOMY     BLADDER SURGERY     CHOLECYSTECTOMY      There were no vitals filed for this visit.    Subjective Assessment - 04/10/22 1019     Subjective Pt has been having Rt hip pain for about 2 years. She had x rays that show arthritis. Rt LE has swelling throughout LE that Dr T believes is coming from her back. Pain is constant and increases with laying on her hip, decreases with use of heat. Lt shoulder has also been bothering her for 2-3 years. Pain in shoulder increases with flexion past 90, donning/doffing bra and shirts.    Pertinent History Broken pelvis at age 39, CABG 2020    Limitations Lifting    Diagnostic tests mild arthritis in lumbar spine, moderate arthritis Lt shoulder    Patient Stated Goals decrease pain with work and household activities    Currently in Pain? Yes    Pain Score 6     Pain Location Hip    Pain Orientation Right   and Lt shoulder   Pain Descriptors / Indicators Sore;Sharp;Aching    Pain Type Chronic pain    Pain Onset More than a month ago    Pain Frequency Constant     Aggravating Factors  lifting, laying on side    Pain Relieving Factors heat, aleve                [facility] PT Assessment - 04/10/22 0001       Assessment   Medical Diagnosis Rt sided low back pain, Lt shoulder pain    Referring Provider (PT) Thekkekandam    Onset Date/Surgical Date 03/26/20    Hand Dominance Right    Next MD Visit 05/14/22      Precautions   Precautions None      Restrictions   Weight Bearing Restrictions No      Balance Screen   Has the patient fallen in the past 6 months No      Prior Function   Level of Independence Independent    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, carrying      Observation/Other Assessments   Focus on Therapeutic Outcomes (FOTO)  35      Posture/Postural Control   Posture Comments rounded shoulders      ROM / Strength   AROM / PROM / Strength AROM;Strength      AROM   AROM Assessment Site Shoulder;Lumbar    Right/Left Shoulder Left  Left Shoulder Flexion 115 Degrees    Left Shoulder ABduction 62 Degrees    Left Shoulder Internal Rotation 34 Degrees    Left Shoulder External Rotation 26 Degrees    Lumbar Flexion WFL    Lumbar Extension 25% - pain    Lumbar - Right Side Bend 50% pain    Lumbar - Left Side Bend 50% pain    Lumbar - Right Rotation WFL    Lumbar - Left Rotation [facility]      Strength   Overall Strength Comments bilat elbow flex/ext grossly 4+/5    Strength Assessment Site Shoulder;Hip    Right/Left Shoulder Right;Left    Right Shoulder Flexion 3+/5    Right Shoulder ABduction 3+/5    Left Shoulder Flexion 3-/5    Left Shoulder ABduction 3-/5    Right/Left Hip Right;Left    Right Hip Flexion 3/5    Right Hip Extension 3-/5    Right Hip ABduction 3-/5    Left Hip Flexion 3+/5    Left Hip Extension 3+/5      Palpation   SI assessment  TTP Rt SIJ, hypomobile    Palpation comment hypermobility Lt GH jt, pain with jt mobility in all directions. TTP Rt hip jt, Rt ITB      Special Tests    Other special tests SLR (-) bilat, FABER (-) bilat                        Objective measurements completed on examination: See above findings.       [facility] Adult PT Treatment/Exercise - 04/10/22 0001       Exercises   Exercises Shoulder;Lumbar      Lumbar Exercises: Stretches   Lower Trunk Rotation Limitations 10 reps - 5 sec hold      Lumbar Exercises: Supine   Bridge 10 reps      Shoulder Exercises: Standing   Row 10 reps    Theraband Level (Shoulder Row) Level 2 (Red)      Modalities   Modalities Electrical Stimulation;Moist Heat      Moist Heat Therapy   Number Minutes Moist Heat 10 Minutes    Moist Heat Location Shoulder      Electrical Stimulation   Electrical Stimulation Location Lt shoulder    Electrical Stimulation Action TENS    Electrical Stimulation Parameters to tolerance    Electrical Stimulation Goals Pain                     PT Education - 04/10/22 1109     Education Details PT POC and goals, HEP    Person(s) Educated Patient    Methods Explanation;Demonstration;Handout    Comprehension Returned demonstration;Verbalized understanding                 PT Long Term Goals - 04/10/22 1158       PT LONG TERM GOAL #1   Title Pt will be independent with HEP    Time 6    Period Weeks    Status New    Target Date 05/22/22      PT LONG TERM GOAL #2   Title Pt will improve LT shoulder ROM by 40 degrees in each direction    Time 6    Period Weeks    Status New    Target Date 05/22/22      PT LONG TERM GOAL #3   Title Pt will improve bilat LE strength  to 4+/5    Time 6    Period Weeks    Status New    Target Date 05/22/22      PT LONG TERM GOAL #4   Title Pt will improve bilat shoulder strength to 4+/5    Time 6    Period Weeks    Status New    Target Date 05/22/22      PT LONG TERM GOAL #5   Title Pt will improve FOTO to >= 50 to demo improved functional mobility    Time 6    Period Weeks     Status New    Target Date 05/22/22                    Plan - 04/10/22 1153     Clinical Impression Statement Pt is a 56 y/o female referred for chronic Rt sided LBP and LT shoulder pain. pt presents with tendernes in Rt SIJ as well as Rt hip jt. Decreased hip strength and decreased lumbar ROM, decreased LT shoulder strength and ROM, decreased functional mobility, impaired posture. Pt will benefit from skilled PT to address deficits and improve functional mobility    Personal Factors and Comorbidities Comorbidity 3+    Examination-Activity Limitations Reach Overhead;Lift;Sit;Bed Mobility    Examination-Participation Restrictions Community Activity;Occupation;Driving;Yard Work;Cleaning    Stability/Clinical Decision Making Evolving/Moderate complexity    Clinical Decision Making Moderate    Rehab Potential Good    PT Frequency 2x / week    PT Duration 6 weeks    PT Treatment/Interventions Aquatic Therapy;Cryotherapy;Electrical Stimulation;Iontophoresis '4mg'$ /ml Dexamethasone;Moist Heat;Therapeutic exercise;Balance training;Therapeutic activities;Neuromuscular re-education;Functional mobility training;Manual techniques;Passive range of motion;Dry needling;Taping;Vasopneumatic Device    PT Next Visit Plan assess and progress HEP, core strength, shoulder ROM    PT Home Exercise Plan [id number]             Patient will benefit from skilled therapeutic intervention in order to improve the following deficits and impairments:  Pain, Postural dysfunction, Impaired UE functional use, Hypermobility, Decreased strength, Decreased activity tolerance, Decreased range of motion, Hypomobility, Decreased mobility  Visit Diagnosis: Other low back pain - Plan: PT plan of care cert/re-cert  Chronic left shoulder pain - Plan: PT plan of care cert/re-cert  Muscle weakness (generalized) - Plan: PT plan of care cert/re-cert  Abnormal posture - Plan: PT plan of care cert/re-cert     Problem  List Patient Active Problem List   Diagnosis Date Noted   Chronic left shoulder pain 04/01/2022   Stress 10/03/2021   Coronary artery disease involving coronary bypass graft of native heart without angina pectoris 02/19/2021   Neuropathy due to secondary diabetes ([place]) 02/19/2021   Gross hematuria 02/19/2021   Diabetes mellitus with hemoglobin A1c goal of 7.0%-8.0% ([place]) 02/19/2021   Depressed mood 02/19/2021   NSTEMI (non-ST elevated myocardial infarction) ([place]) 02/19/2021   Class 1 obesity due to excess calories with serious comorbidity and body mass index (BMI) of 32.0 to 32.9 in adult 02/19/2021   PTSD (post-traumatic stress disorder) 02/19/2021   Degenerative arthritis of thumb, left 11/05/2020   Degenerative arthritis of thumb, right 11/05/2020   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[place]) 11/05/2020   Non compliance w medication regimen 11/05/2020   Bilateral thumb pain 11/05/2020   Anxiety state 11/05/2020   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-sided sciatica 11/05/2020   Depression, major, single episode, mild ([place]) 02/06/2020   Right upper quadrant abdominal pain 01/30/2020   Tobacco dependence 12/25/2019   Tobacco abuse 12/05/2018  No energy 01/30/2018   Chronic pain of both knees 01/30/2018   Trochanteric bursitis of right hip 01/06/2018   Right knee pain 01/06/2018   Hyperlipidemia associated with type 2 diabetes mellitus ([place]) 07/13/2017   Primary osteoarthritis of right hip 07/13/2017   Right hip pain 07/11/2017   Hypertriglyceridemia 01/01/2017   GAD (generalized anxiety disorder) 12/25/2016   Insomnia 12/25/2016   Polyarthralgia 12/25/2016   CVA tenderness 03/30/2016   Type 2 diabetes mellitus with hyperglycemia, with long-term current use of insulin ([place]) 01/27/2016   Onychomycosis 01/27/2016   Hepatic steatosis 11/27/2014   Adenoma of left adrenal gland 11/27/2014   Constipation 11/27/2014   GERD (gastroesophageal reflux disease) 11/08/2013    Epigastric pain 11/08/2013   Uncontrolled diabetes mellitus ([place]) 11/08/2013   ANKLE PAIN 02/02/2011    Izabela Ow, PT 04/10/2022, 12:02 PM  [place], [place], [address] Phone: [phone]   Fax:  [phone]  Name: Sofija Antwi MRN: [id number] Date of Birth: April 04, 1966

## 2022-04-10 NOTE — Patient Instructions (Signed)
Access Code: [email or web address] URL: [email or web address] Date: 04/10/2022 Prepared by: Isabelle Course  Exercises - Seated Scapular Retraction  - 1 x daily - 7 x weekly - 2 sets - 10 reps - Standing Shoulder Row with Anchored Resistance  - 1 x daily - 7 x weekly - 2 sets - 10 reps - Supine Lower Trunk Rotation  - 1 x daily - 7 x weekly - 2 sets - 10 reps - Supine Bridge  - 1 x daily - 7 x weekly - 2 sets - 10 reps  Patient Education - Trigger Point Dry Needling

## 2022-04-20 ENCOUNTER — Ambulatory Visit: Payer: No Typology Code available for payment source | Admitting: Rehabilitative and Restorative Service Providers"

## 2022-05-14 ENCOUNTER — Ambulatory Visit (INDEPENDENT_AMBULATORY_CARE_PROVIDER_SITE_OTHER): Payer: No Typology Code available for payment source | Admitting: Sports Medicine

## 2022-05-14 DIAGNOSIS — M1611 Unilateral primary osteoarthritis, right hip: Secondary | ICD-10-CM | POA: Diagnosis not present

## 2022-05-14 DIAGNOSIS — M25512 Pain in left shoulder: Secondary | ICD-10-CM

## 2022-05-14 DIAGNOSIS — G8929 Other chronic pain: Secondary | ICD-10-CM

## 2022-05-14 MED ORDER — NAPROXEN 500 MG PO TABS: 500.0000 mg | ORAL_TABLET | Freq: Two times a day (BID) | ORAL | 3 refills | Status: DC

## 2022-05-14 NOTE — Assessment & Plan Note (Signed)
Pain right groin, we will give her some hip conditioning, initially suspected to be referred pain from the low back, this seems to have resolved, after a month of hip conditioning if not much better we will do a hip joint injection under ultrasound guidance. Also switching to prescription strength naproxen.

## 2022-05-14 NOTE — Progress Notes (Signed)
    Procedures performed today:    None.  Independent interpretation of notes and tests performed by another provider:   None.  Brief History, Exam, Impression, and Recommendations:    Chronic left shoulder pain Left shoulder pain localized over the deltoid, suspect impingement syndrome. We will add additional home conditioning, and she will come back in a month and we can do a subacromial versus glenohumeral injection when she returns, this will be based on what pain generator declares itself as the primary.  Primary osteoarthritis of right hip Pain right groin, we will give her some hip conditioning, initially suspected to be referred pain from the low back, this seems to have resolved, after a month of hip conditioning if not much better we will do a hip joint injection under ultrasound guidance. Also switching to prescription strength naproxen.  Chronic process with exacerbation and pharmacologic intervention  ____________________________________________ Gwen Her. Dianah Field, M.D., ABFM., CAQSM., AME. Primary Care and Sports Medicine [facility]  Adjunct Professor of [employer] of Medicine  Risk manager

## 2022-05-14 NOTE — Assessment & Plan Note (Signed)
Left shoulder pain localized over the deltoid, suspect impingement syndrome. We will add additional home conditioning, and she will come back in a month and we can do a subacromial versus glenohumeral injection when she returns, this will be based on what pain generator declares itself as the primary.

## 2022-06-15 ENCOUNTER — Ambulatory Visit: Payer: No Typology Code available for payment source | Admitting: Sports Medicine

## 2022-06-30 ENCOUNTER — Ambulatory Visit (INDEPENDENT_AMBULATORY_CARE_PROVIDER_SITE_OTHER): Payer: No Typology Code available for payment source | Admitting: Physician Assistant

## 2022-06-30 ENCOUNTER — Encounter: Payer: Self-pay | Admitting: Physician Assistant

## 2022-06-30 VITALS — BP 155/83 | HR 84 | Wt 213.1 lb

## 2022-06-30 DIAGNOSIS — E119 Type 2 diabetes mellitus without complications: Secondary | ICD-10-CM

## 2022-06-30 DIAGNOSIS — E1165 Type 2 diabetes mellitus with hyperglycemia: Secondary | ICD-10-CM

## 2022-06-30 DIAGNOSIS — Z794 Long term (current) use of insulin: Secondary | ICD-10-CM | POA: Diagnosis not present

## 2022-06-30 DIAGNOSIS — Z1231 Encounter for screening mammogram for malignant neoplasm of breast: Secondary | ICD-10-CM

## 2022-06-30 DIAGNOSIS — E1340 Other specified diabetes mellitus with diabetic neuropathy, unspecified: Secondary | ICD-10-CM | POA: Diagnosis not present

## 2022-06-30 DIAGNOSIS — Z716 Tobacco abuse counseling: Secondary | ICD-10-CM

## 2022-06-30 LAB — POCT GLYCOSYLATED HEMOGLOBIN (HGB A1C): Hemoglobin A1C: 7 % — AB (ref 4.0–5.6)

## 2022-06-30 LAB — POCT UA - MICROALBUMIN
Albumin/Creatinine Ratio, Urine, POC: 30
Creatinine, POC: 300 mg/dL
Microalbumin Ur, POC: 30 mg/L

## 2022-06-30 MED ORDER — NICOTINE 21 MG/24HR TD PT24: 21.0000 mg | MEDICATED_PATCH | Freq: Every day | TRANSDERMAL | 2 refills | Status: DC

## 2022-06-30 MED ORDER — TRULICITY 0.75 MG/0.5ML ~~LOC~~ SOAJ: 0.7500 mg | SUBCUTANEOUS | 2 refills | Status: DC

## 2022-06-30 MED ORDER — METFORMIN HCL ER 500 MG PO TB24: 1000.0000 mg | ORAL_TABLET | Freq: Every day | ORAL | 5 refills | Status: DC

## 2022-06-30 NOTE — Patient Instructions (Addendum)
Start nicotine patch and over 3 months STOP smoking Get colonoscopy Get mammogram   Managing the Challenge of Quitting Smoking Quitting smoking is a physical and mental challenge. You may have cravings, withdrawal symptoms, and temptation to smoke. Before quitting, work with your health care provider to make a plan that can help you manage quitting. Making a plan before you quit may keep you from smoking when you have the urge to smoke while trying to quit. How to manage lifestyle changes Managing stress Stress can make you want to smoke, and wanting to smoke may cause stress. It is important to find ways to manage your stress. You could try some of the following: Practice relaxation techniques. Breathe slowly and deeply, in through your nose and out through your mouth. Listen to music. Soak in a bath or take a shower. Imagine a peaceful place or vacation. Get some support. Talk with family or friends about your stress. Join a support group. Talk with a counselor or therapist. Get some physical activity. Go for a walk, run, or bike ride. Play a favorite sport. Practice yoga.  Medicines Talk with your health care provider about medicines that might help you deal with cravings and make quitting easier for you. Relationships Social situations can be difficult when you are quitting smoking. To manage this, you can: Avoid parties and other social situations where people might be smoking. Avoid alcohol. Leave right away if you have the urge to smoke. Explain to your family and friends that you are quitting smoking. Ask for support and let them know you might be a bit grumpy. Plan activities where smoking is not an option. General instructions Be aware that many people gain weight after they quit smoking. However, not everyone does. To keep from gaining weight, have a plan in place before you quit, and stick to the plan after you quit. Your plan should include: Eating healthy snacks. When  you have a craving, it may help to: Eat popcorn, or try carrots, celery, or other cut vegetables. Chew sugar-free gum. Changing how you eat. Eat small portion sizes at meals. Eat 4-6 small meals throughout the day instead of 1-2 large meals a day. Be mindful when you eat. You should avoid watching television or doing other things that might distract you as you eat. Exercising regularly. Make time to exercise each day. If you do not have time for a long workout, do short bouts of exercise for 5-10 minutes several times a day. Do some form of strengthening exercise, such as weight lifting. Do some exercise that gets your heart beating and causes you to breathe deeply, such as walking fast, running, swimming, or biking. This is very important. Drinking plenty of water or other low-calorie or no-calorie drinks. Drink enough fluid to keep your urine pale yellow.  How to recognize withdrawal symptoms Your body and mind may experience discomfort as you try to get used to not having nicotine in your system. These effects are called withdrawal symptoms. They may include: Feeling hungrier than normal. Having trouble concentrating. Feeling irritable or restless. Having trouble sleeping. Feeling depressed. Craving a cigarette. These symptoms may surprise you, but they are normal to have when quitting smoking. To manage withdrawal symptoms: Avoid places, people, and activities that trigger your cravings. Remember why you want to quit. Get plenty of sleep. Avoid coffee and other drinks that contain caffeine. These may worsen some of your symptoms. How to manage cravings Come up with a plan for how to deal  with your cravings. The plan should include the following: A definition of the specific situation you want to deal with. An activity or action you will take to replace smoking. A clear idea for how this action will help. The name of someone who could help you with this. Cravings usually last for  5-10 minutes. Consider taking the following actions to help you with your plan to deal with cravings: Keep your mouth busy. Chew sugar-free gum. Suck on hard candies or a straw. Brush your teeth. Keep your hands and body busy. Change to a different activity right away. Squeeze or play with a ball. Do an activity or a hobby, such as making bead jewelry, practicing needlepoint, or working with wood. Mix up your normal routine. Take a short exercise break. Go for a quick walk, or run up and down stairs. Focus on doing something kind or helpful for someone else. Call a friend or family member to talk during a craving. Join a support group. Contact a quitline. Where to find support To get help or find a support group: Call the [facility]'s Smoking Quitline: 1-800-QUIT-NOW (208)384-1280) Text QUIT to SmokefreeTXT: 962229 Where to find more information Visit these websites to find more information on quitting smoking: U.S. Department of Health and Human Services: www.smokefree.gov American Lung Association: www.freedomfromsmoking.org Centers for Disease Control and Prevention (CDC): http://www.wolf.info/ American Heart Association: www.heart.org Contact a health care provider if: You want to change your plan for quitting. The medicines you are taking are not helping. Your eating feels out of control or you cannot sleep. You feel depressed or become very anxious. Summary Quitting smoking is a physical and mental challenge. You will face cravings, withdrawal symptoms, and temptation to smoke again. Preparation can help you as you go through these challenges. Try different techniques to manage stress, handle social situations, and prevent weight gain. You can deal with cravings by keeping your mouth busy (such as by chewing gum), keeping your hands and body busy, calling family or friends, or contacting a quitline for people who want to quit smoking. You can deal with withdrawal symptoms by  avoiding places where people smoke, getting plenty of rest, and avoiding drinks that contain caffeine. This information is not intended to replace advice given to you by your health care provider. Make sure you discuss any questions you have with your health care provider. Document Revised: 10/03/2021 Document Reviewed: 10/03/2021 Elsevier Patient Education  [place].

## 2022-07-08 ENCOUNTER — Encounter: Payer: Self-pay | Admitting: Physician Assistant

## 2022-07-08 NOTE — Progress Notes (Signed)
Established Patient Office Visit  Subjective   Patient ID: Denise Tyler, female    DOB: Jan 18, 1966  Age: 56 y.o. MRN: [id number]  Chief Complaint  Patient presents with   Diabetes    3 month f/u    HPI Pt is a 56 yo obese female with T2DM, HTN, HLD, CAD and hx of MI and stents and current smoker who presents to the clinic for 3 month follow up.   Pt is not checking sugars. She is taking trulicity and metoformin. She is nauseated with medication. No hypoglycemic symptoms. No open sores or wounds. No CP, palpitations, headaches or vision changes.   She does want to stop smoking.   She admits she has not been taking cholesterol medications because "she wants to get off some of these medications".   .. Active Ambulatory Problems    Diagnosis Date Noted   ANKLE PAIN 02/02/2011   GERD (gastroesophageal reflux disease) 11/08/2013   Epigastric pain 11/08/2013   Uncontrolled diabetes mellitus ([place]) 11/08/2013   Hepatic steatosis 11/27/2014   Adenoma of left adrenal gland 11/27/2014   Constipation 11/27/2014   Type 2 diabetes mellitus with hyperglycemia, with long-term current use of insulin ([place]) 01/27/2016   Onychomycosis 01/27/2016   CVA tenderness 03/30/2016   GAD (generalized anxiety disorder) 12/25/2016   Insomnia 12/25/2016   Polyarthralgia 12/25/2016   Hypertriglyceridemia 01/01/2017   Right hip pain 07/11/2017   Hyperlipidemia associated with type 2 diabetes mellitus ([place]) 07/13/2017   Primary osteoarthritis of right hip 07/13/2017   Trochanteric bursitis of right hip 01/06/2018   Right knee pain 01/06/2018   No energy 01/30/2018   Chronic pain of both knees 01/30/2018   Tobacco abuse 12/05/2018   Tobacco dependence 12/25/2019   Right upper quadrant abdominal pain 01/30/2020   Depression, major, single episode, mild ([place]) 02/06/2020   Degenerative arthritis of thumb, left 11/05/2020   Degenerative arthritis of thumb, right 11/05/2020   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[place]) 11/05/2020   Non compliance w medication regimen 11/05/2020   Bilateral thumb pain 11/05/2020   Anxiety state 11/05/2020   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-sided sciatica 11/05/2020   Coronary artery disease involving coronary bypass graft of native heart without angina pectoris 02/19/2021   Neuropathy due to secondary diabetes ([place]) 02/19/2021   Gross hematuria 02/19/2021   Diabetes mellitus with hemoglobin A1c goal of 7.0%-8.0% ([place]) 02/19/2021   Depressed mood 02/19/2021   NSTEMI (non-ST elevated myocardial infarction) ([place]) 02/19/2021   Class 1 obesity due to excess calories with serious comorbidity and body mass index (BMI) of 32.0 to 32.9 in adult 02/19/2021   PTSD (post-traumatic stress disorder) 02/19/2021   Stress 10/03/2021   Chronic left shoulder pain 04/01/2022   Resolved Ambulatory Problems    Diagnosis Date Noted   Black tarry stools 03/30/2016   Past Medical History:  Diagnosis Date   Diabetes mellitus without complication ([place])    Mini stroke      ROS See HPI.    Objective:     BP (!) 155/83   Pulse 84   Wt 213 lb 1.9 oz (96.7 kg)   SpO2 97%   BMI 33.38 kg/m  BP Readings from Last 3 Encounters:  06/30/22 (!) 155/83  03/27/22 121/74  10/03/21 138/71   Wt Readings from Last 3 Encounters:  06/30/22 213 lb 1.9 oz (96.7 kg)  03/27/22 212 lb (96.2 kg)  10/03/21 210 lb (95.3 kg)      Physical Exam Constitutional:  Appearance: Normal appearance. [name].  HENT:     Head: Normocephalic.  Cardiovascular:     Rate and Rhythm: Normal rate and regular rhythm.     Pulses: Normal pulses.  Pulmonary:     Effort: Pulmonary effort is normal.     Breath sounds: Normal breath sounds.  Neurological:     General: No focal deficit present.     Mental Status: She is alert and oriented to person, place, and time.  Psychiatric:        Mood and Affect: Mood normal.      Results for orders placed or performed in visit on  06/30/22  POCT glycosylated hemoglobin (Hb A1C)  Result Value Ref Range   Hemoglobin A1C 7.0 (A) 4.0 - 5.6 %   HbA1c POC (<> result, manual entry)     HbA1c, POC (prediabetic range)     HbA1c, POC (controlled diabetic range)    POCT UA - Microalbumin  Result Value Ref Range   Microalbumin Ur, POC 30 mg/L   Creatinine, POC 300 mg/dL   Albumin/Creatinine Ratio, Urine, POC 30        Assessment & Plan:  [name] KitchenMarland KitchenLoleta was seen today for diabetes.  Diagnoses and all orders for this visit:  Type 2 diabetes mellitus with hyperglycemia, with long-term current use of insulin (HCC) -     POCT glycosylated hemoglobin (Hb A1C) -     POCT UA - Microalbumin -     Dulaglutide (TRULICITY) 1.69 IH/0.3UU SOPN; Inject 0.75 mg into the skin once a week.  Neuropathy due to secondary diabetes (HCC) -     metFORMIN (GLUCOPHAGE XR) 500 MG 24 hr tablet; Take 2 tablets (1,000 mg total) by mouth daily with breakfast.  Diabetes mellitus with hemoglobin A1c goal of 7.0%-8.0% ([facility]) -     metFORMIN (GLUCOPHAGE XR) 500 MG 24 hr tablet; Take 2 tablets (1,000 mg total) by mouth daily with breakfast. -     Dulaglutide (TRULICITY) 8.28 MK/3.4JZ SOPN; Inject 0.75 mg into the skin once a week.  Visit for screening mammogram -     MM 3D SCREEN BREAST BILATERAL; Future  Encounter for smoking cessation counseling -     nicotine (NICODERM CQ) 21 mg/24hr patch; Place 1 patch (21 mg total) onto the skin daily.   A1C much better would like under 6.5 Continue same dose of trulicity since nausea was still a little bit of the problem Continue metformin BP not to goal, discussed ACE inhibitor pt declined not wanting to start another medication Discussed goal BP 130/80  Get back on statin, discussed importance Microalbumin normal Foot exam needed Needs eye exam Declined flu/covid/shingles/pneumonia vaccine Discussed smoking cessation Start patch and decrease to stop over next 3 months  Long conversation and about  medications and how with her risk are very important to stay on so that she does not have another event.   Reminded to get colonoscopy and mammogram.    Return in about 3 months (around 09/29/2022).    Iran Planas, PA-C

## 2022-07-09 ENCOUNTER — Ambulatory Visit (INDEPENDENT_AMBULATORY_CARE_PROVIDER_SITE_OTHER): Payer: No Typology Code available for payment source

## 2022-07-09 DIAGNOSIS — Z1231 Encounter for screening mammogram for malignant neoplasm of breast: Secondary | ICD-10-CM | POA: Diagnosis not present

## 2022-07-10 NOTE — Progress Notes (Signed)
Normal mammogram. Follow up in 1 year.

## 2022-07-21 ENCOUNTER — Telehealth: Payer: Self-pay | Admitting: Physician Assistant

## 2022-07-21 NOTE — Telephone Encounter (Signed)
Echo:   Normal ejection fraction.  Mild hardening of aortic valve.  Trace mitral and tricuspid regurgitation.   Overall a good echo.

## 2022-07-21 NOTE — Telephone Encounter (Signed)
Patient made aware of results. Encouraged to restart Statin.

## 2022-09-16 ENCOUNTER — Encounter: Payer: Self-pay | Admitting: Physician Assistant

## 2022-09-16 ENCOUNTER — Ambulatory Visit (INDEPENDENT_AMBULATORY_CARE_PROVIDER_SITE_OTHER): Payer: No Typology Code available for payment source | Admitting: Physician Assistant

## 2022-09-16 VITALS — BP 127/89 | HR 92 | Ht 67.0 in | Wt 213.0 lb

## 2022-09-16 DIAGNOSIS — F17200 Nicotine dependence, unspecified, uncomplicated: Secondary | ICD-10-CM | POA: Diagnosis not present

## 2022-09-16 DIAGNOSIS — I10 Essential (primary) hypertension: Secondary | ICD-10-CM | POA: Insufficient documentation

## 2022-09-16 DIAGNOSIS — J441 Chronic obstructive pulmonary disease with (acute) exacerbation: Secondary | ICD-10-CM

## 2022-09-16 DIAGNOSIS — Z122 Encounter for screening for malignant neoplasm of respiratory organs: Secondary | ICD-10-CM

## 2022-09-16 DIAGNOSIS — I252 Old myocardial infarction: Secondary | ICD-10-CM

## 2022-09-16 DIAGNOSIS — E6609 Other obesity due to excess calories: Secondary | ICD-10-CM

## 2022-09-16 DIAGNOSIS — E1165 Type 2 diabetes mellitus with hyperglycemia: Secondary | ICD-10-CM

## 2022-09-16 DIAGNOSIS — I2510 Atherosclerotic heart disease of native coronary artery without angina pectoris: Secondary | ICD-10-CM | POA: Diagnosis not present

## 2022-09-16 DIAGNOSIS — Z6833 Body mass index (BMI) 33.0-33.9, adult: Secondary | ICD-10-CM

## 2022-09-16 HISTORY — DX: Essential (primary) hypertension: I10

## 2022-09-16 MED ORDER — AZITHROMYCIN 250 MG PO TABS: ORAL_TABLET | ORAL | 0 refills | Status: DC

## 2022-09-16 MED ORDER — ALBUTEROL SULFATE HFA 108 (90 BASE) MCG/ACT IN AERS: 2.0000 | INHALATION_SPRAY | Freq: Four times a day (QID) | RESPIRATORY_TRACT | 0 refills | Status: DC | PRN

## 2022-09-16 MED ORDER — PREDNISONE 20 MG PO TABS: 20.0000 mg | ORAL_TABLET | Freq: Every day | ORAL | 0 refills | Status: DC

## 2022-09-16 MED ORDER — TIRZEPATIDE 5 MG/0.5ML ~~LOC~~ SOAJ: 5.0000 mg | SUBCUTANEOUS | 0 refills | Status: DC

## 2022-09-16 MED ORDER — NITROGLYCERIN 0.4 MG SL SUBL: 0.4000 mg | SUBLINGUAL_TABLET | SUBLINGUAL | 0 refills | Status: DC | PRN

## 2022-09-16 NOTE — Patient Instructions (Addendum)
Need to schedule your annual low dose CT scan for lung cancer screening Stop trulicity and start monjaro Continue metformin Start zpak and use albuterol inhaler as needed for SOB and cough  Diabetes Mellitus and Nutrition, Adult When you have diabetes, or diabetes mellitus, it is very important to have healthy eating habits because your blood sugar (glucose) levels are greatly affected by what you eat and drink. Eating healthy foods in the right amounts, at about the same times every day, can help you: Manage your blood glucose. Lower your risk of heart disease. Improve your blood pressure. Reach or maintain a healthy weight. What can affect my meal plan? Every person with diabetes is different, and each person has different needs for a meal plan. Your health care provider may recommend that you work with a dietitian to make a meal plan that is best for you. Your meal plan may vary depending on factors such as: The calories you need. The medicines you take. Your weight. Your blood glucose, blood pressure, and cholesterol levels. Your activity level. Other health conditions you have, such as heart or kidney disease. How do carbohydrates affect me? Carbohydrates, also called carbs, affect your blood glucose level more than any other type of food. Eating carbs raises the amount of glucose in your blood. It is important to know how many carbs you can safely have in each meal. This is different for every person. Your dietitian can help you calculate how many carbs you should have at each meal and for each snack. How does alcohol affect me? Alcohol can cause a decrease in blood glucose (hypoglycemia), especially if you use insulin or take certain diabetes medicines by mouth. Hypoglycemia can be a life-threatening condition. Symptoms of hypoglycemia, such as sleepiness, dizziness, and confusion, are similar to symptoms of having too much alcohol. Do not drink alcohol if: Your health care provider  tells you not to drink. You are pregnant, may be pregnant, or are planning to become pregnant. If you drink alcohol: Limit how much you have to: 0-1 drink a day for women. 0-2 drinks a day for men. Know how much alcohol is in your drink. In the [place] one drink equals one 12 oz bottle of beer (355 mL), one 5 oz glass of wine (148 mL), or one 1 oz glass of hard liquor (44 mL). Keep yourself hydrated with water, diet soda, or unsweetened iced tea. Keep in mind that regular soda, juice, and other mixers may contain a lot of sugar and must be counted as carbs. What are tips for following this plan?  Reading food labels Start by checking the serving size on the Nutrition Facts label of packaged foods and drinks. The number of calories and the amount of carbs, fats, and other nutrients listed on the label are based on one serving of the item. Many items contain more than one serving per package. Check the total grams (g) of carbs in one serving. Check the number of grams of saturated fats and trans fats in one serving. Choose foods that have a low amount or none of these fats. Check the number of milligrams (mg) of salt (sodium) in one serving. Most people should limit total sodium intake to less than 2,300 mg per day. Always check the nutrition information of foods labeled as "low-fat" or "nonfat." These foods may be higher in added sugar or refined carbs and should be avoided. Talk to your dietitian to identify your daily goals for nutrients listed on the label.  Shopping Avoid buying canned, pre-made, or processed foods. These foods tend to be high in fat, sodium, and added sugar. Shop around the outside edge of the grocery store. This is where you will most often find fresh fruits and vegetables, bulk grains, fresh meats, and fresh dairy products. Cooking Use low-heat cooking methods, such as baking, instead of high-heat cooking methods, such as deep frying. Cook using healthy oils, such as olive,  canola, or sunflower oil. Avoid cooking with butter, cream, or high-fat meats. Meal planning Eat meals and snacks regularly, preferably at the same times every day. Avoid going long periods of time without eating. Eat foods that are high in fiber, such as fresh fruits, vegetables, beans, and whole grains. Eat 4-6 oz (112-168 g) of lean protein each day, such as lean meat, chicken, fish, eggs, or tofu. One ounce (oz) (28 g) of lean protein is equal to: 1 oz (28 g) of meat, chicken, or fish. 1 egg.  cup (62 g) of tofu. Eat some foods each day that contain healthy fats, such as avocado, nuts, seeds, and fish. What foods should I eat? Fruits Berries. Apples. Oranges. Peaches. Apricots. Plums. Grapes. Mangoes. Papayas. Pomegranates. Kiwi. Cherries. Vegetables Leafy greens, including lettuce, spinach, kale, chard, collard greens, mustard greens, and cabbage. Beets. Cauliflower. Broccoli. Carrots. Green beans. Tomatoes. Peppers. Onions. Cucumbers. Brussels sprouts. Grains Whole grains, such as whole-wheat or whole-grain bread, crackers, tortillas, cereal, and pasta. Unsweetened oatmeal. Quinoa. Brown or wild rice. Meats and other proteins Seafood. Poultry without skin. Lean cuts of poultry and beef. Tofu. Nuts. Seeds. Dairy Low-fat or fat-free dairy products such as milk, yogurt, and cheese. The items listed above may not be a complete list of foods and beverages you can eat and drink. Contact a dietitian for more information. What foods should I avoid? Fruits Fruits canned with syrup. Vegetables Canned vegetables. Frozen vegetables with butter or cream sauce. Grains Refined white flour and flour products such as bread, pasta, snack foods, and cereals. Avoid all processed foods. Meats and other proteins Fatty cuts of meat. Poultry with skin. Breaded or fried meats. Processed meat. Avoid saturated fats. Dairy Full-fat yogurt, cheese, or milk. Beverages Sweetened drinks, such as soda or  iced tea. The items listed above may not be a complete list of foods and beverages you should avoid. Contact a dietitian for more information. Questions to ask a health care provider Do I need to meet with a certified diabetes care and education specialist? Do I need to meet with a dietitian? What number can I call if I have questions? When are the best times to check my blood glucose? Where to find more information: American Diabetes Association: diabetes.org Academy of Nutrition and Dietetics: eatright.[employer] of Diabetes and Digestive and Kidney Diseases: [employer]en[employer].is Association of Diabetes Care & Education Specialists: diabeteseducator.org Summary It is important to have healthy eating habits because your blood sugar (glucose) levels are greatly affected by what you eat and drink. It is important to use alcohol carefully. A healthy meal plan will help you manage your blood glucose and lower your risk of heart disease. Your health care provider may recommend that you work with a dietitian to make a meal plan that is best for you. This information is not intended to replace advice given to you by your health care provider. Make sure you discuss any questions you have with your health care provider. Document Revised: 05/15/2020 Document Reviewed: 05/15/2020 Elsevier Patient Education  [place].

## 2022-09-16 NOTE — Progress Notes (Signed)
Established Patient Office Visit  Subjective   Patient ID: Denise Tyler, female    DOB: 1966-10-15  Age: 56 y.o. MRN: [id number]  Chief Complaint  Patient presents with   Heart Problem    HPI Pt is a 56 yo obese female who presents to the clinic to follow up from ED on 11/16 and admission for monitoring.   Pt presented to the clinic with CP and pressure that last all night. She has CAD and history of CABG in March 2022. She was taken to cath lab with no PCI required. Her medications were changed a little for more BP control. She is on ASA, Plavix, LIpitor, metoprolol, amlodipine. CP resolved in hospital.  Follow up with Cardiology December 13th.   A1C 9.4 in hospital.   Procedures:emergent cath:  1st Diag lesion is 100% stenosed.  Mid LAD lesion is 100% stenosed.  Dist RCA lesion is 100% stenosed.  Mid RCA lesion is 75% stenosed.  1st Mrg lesion is 25% stenosed.   Pt continues to smoke but she has decreased down to 6 a day. She has a productive cough for last month that will not resolve. No fever, chills, body aches. Not using any inhalers.   She reports she is taking her trulicity weekly and metformin daily. Her sugars in hospital were high 200s but she is home and they have been high 100s now. No hypoglycemic events.     .. Active Ambulatory Problems    Diagnosis Date Noted   ANKLE PAIN 02/02/2011   GERD (gastroesophageal reflux disease) 11/08/2013   Epigastric pain 11/08/2013   Uncontrolled diabetes mellitus ([place]) 11/08/2013   Hepatic steatosis 11/27/2014   Adenoma of left adrenal gland 11/27/2014   Constipation 11/27/2014   Type 2 diabetes mellitus with hyperglycemia, with long-term current use of insulin ([place]) 01/27/2016   Onychomycosis 01/27/2016   CVA tenderness 03/30/2016   GAD (generalized anxiety disorder) 12/25/2016   Insomnia 12/25/2016   Polyarthralgia 12/25/2016   Hypertriglyceridemia 01/01/2017   Right hip pain 07/11/2017   Hyperlipidemia associated  with type 2 diabetes mellitus ([place]) 07/13/2017   Primary osteoarthritis of right hip 07/13/2017   Trochanteric bursitis of right hip 01/06/2018   Right knee pain 01/06/2018   No energy 01/30/2018   Chronic pain of both knees 01/30/2018   Tobacco abuse 12/05/2018   Tobacco dependence 12/25/2019   Right upper quadrant abdominal pain 01/30/2020   Depression, major, single episode, mild ([place]) 02/06/2020   Degenerative arthritis of thumb, left 11/05/2020   Degenerative arthritis of thumb, right 11/05/2020   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[place]) 11/05/2020   Non compliance w medication regimen 11/05/2020   Bilateral thumb pain 11/05/2020   Anxiety state 11/05/2020   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-sided sciatica 11/05/2020   Coronary artery disease involving coronary bypass graft of native heart without angina pectoris 02/19/2021   Neuropathy due to secondary diabetes ([place]) 02/19/2021   Gross hematuria 02/19/2021   Diabetes mellitus with hemoglobin A1c goal of 7.0%-8.0% ([place]) 02/19/2021   Depressed mood 02/19/2021   NSTEMI (non-ST elevated myocardial infarction) ([place]) 02/19/2021   Class 1 obesity due to excess calories with serious comorbidity and body mass index (BMI) of 32.0 to 32.9 in adult 02/19/2021   PTSD (post-traumatic stress disorder) 02/19/2021   Stress 10/03/2021   Chronic left shoulder pain 04/01/2022   CAD, multiple vessel 09/16/2022   Primary hypertension 09/16/2022   Resolved Ambulatory Problems    Diagnosis Date Noted   Black tarry stools  03/30/2016   Past Medical History:  Diagnosis Date   Diabetes mellitus without complication ([place])    Mini stroke      ROS See HPI.    Objective:     BP 127/89   Pulse 92   Ht '5\' 7"'$  (1.702 m)   Wt 213 lb (96.6 kg)   SpO2 100%   BMI 33.36 kg/m  BP Readings from Last 3 Encounters:  09/16/22 127/89  06/30/22 (!) 155/83  03/27/22 121/74   Wt Readings from Last 3 Encounters:  09/16/22 213 lb (96.6 kg)   06/30/22 213 lb 1.9 oz (96.7 kg)  03/27/22 212 lb (96.2 kg)    Duoneb given in office today.   Physical Exam Constitutional:      Appearance: Normal appearance.  HENT:     Head: Normocephalic.  Cardiovascular:     Rate and Rhythm: Normal rate and regular rhythm.     Pulses: Normal pulses.  Pulmonary:     Effort: Pulmonary effort is normal.     Breath sounds: Rhonchi present.  Musculoskeletal:     Right lower leg: No edema.     Left lower leg: No edema.  Neurological:     General: No focal deficit present.     Mental Status: She is alert and oriented to person, place, and time.  Psychiatric:        Mood and Affect: Mood normal.       Assessment & Plan:  [name] KitchenMarland KitchenAnaiza was seen today for heart problem.  Diagnoses and all orders for this visit:  CAD, multiple vessel -     nitroGLYCERIN (NITROSTAT) 0.4 MG SL tablet; Place 1 tablet (0.4 mg total) under the tongue every 5 (five) minutes as needed for chest pain.  Current smoker -     Ambulatory Referral Lung Cancer Screening [facility]  COPD exacerbation ([place]) -     azithromycin (ZITHROMAX Z-PAK) 250 MG tablet; Take 2 tablets (500 mg) on  Day 1,  followed by 1 tablet (250 mg) once daily on Days 2 through 5. -     albuterol (VENTOLIN HFA) 108 (90 Base) MCG/ACT inhaler; Inhale 2 puffs into the lungs every 6 (six) hours as needed. -     predniSONE (DELTASONE) 20 MG tablet; Take 1 tablet (20 mg total) by mouth daily with breakfast.  Encounter for screening for lung cancer -     Ambulatory Referral Lung Cancer Screening [facility]  Class 1 obesity due to excess calories with serious comorbidity and body mass index (BMI) of 33.0 to 33.9 in adult  Primary hypertension -     nitroGLYCERIN (NITROSTAT) 0.4 MG SL tablet; Place 1 tablet (0.4 mg total) under the tongue every 5 (five) minutes as needed for chest pain.  Uncontrolled type 2 diabetes mellitus with hyperglycemia (HCC) -     tirzepatide (MOUNJARO) 5 MG/0.5ML Pen;  Inject 5 mg into the skin once a week.  History of MI (myocardial infarction) -     nitroGLYCERIN (NITROSTAT) 0.4 MG SL tablet; Place 1 tablet (0.4 mg total) under the tongue every 5 (five) minutes as needed for chest pain.   Continue follow up with cardiology Vitals look great today Nitro given for CP as needed  Discussed and strongly urged smoking cessation Lung cancer screen CT ordered COPd exacerbation today-  Duoneb given in office today with improvement  prednisone lower dose due to intolerance and albuterol given today, add zpak if not improving  Discussed sugar control A1C in hospital was  9.4 Discussed DM diet Stop trulicity Start mounjaro for better A1C control Continue metformin Follow up in 3 months  Spent 45 minutes with patient reviewing chart, discussing disease and discussing plan.     Iran Planas, PA-C

## 2022-09-23 ENCOUNTER — Telehealth: Payer: Self-pay

## 2022-09-23 NOTE — Telephone Encounter (Addendum)
Initiated Prior authorization DEY:[id number] '5MG'$ /0.5ML pen-injectors Via: Covermymeds Case/Key:[id number] Status: denied as of 09/23/22 Reason:Coverage for this medication is denied for the following reason(s). We reviewed the information we received about your condition and circumstances. We used the plan approved policy when making this decision. The policy states that this medication may be approved when: -The member has a clinical condition or needs a specific dosage form for which there is no alternative on the formulary OR -The listed formulary alternatives are not recommended based on published guidelines or clinical literature OR -The formulary alternatives will likely be ineffective or less effective for the member OR -The formulary alternatives will likely cause an adverse effect OR -The member is unable to take the required number of formulary alternatives for the given diagnosis due to a trial and inadequate treatment response or contraindication OR -The member has tried and failed the required number of formulary alternatives. Based on the policy and the information we have, your request is denied. We did not receive any documentation that you meet any of the criteria outlined above. Formulary alternative(s) are Trulicity; Victoza. Requirement: 3 in a class with 3 or more alternatives, 2 in a class with 2 alternatives, or 1 in a class with only 1 alternative. Please refer to your plan documents for a complete list of alternatives. Note: Formulary alternatives may require a prior authorization. Your prescriber will be responsible for determining what alternative is appropriate for you. Notified Pt via: Mychart

## 2022-09-29 ENCOUNTER — Ambulatory Visit: Payer: No Typology Code available for payment source | Admitting: Physician Assistant

## 2022-10-12 ENCOUNTER — Ambulatory Visit (INDEPENDENT_AMBULATORY_CARE_PROVIDER_SITE_OTHER): Payer: No Typology Code available for payment source | Admitting: Physician Assistant

## 2022-10-12 ENCOUNTER — Encounter: Payer: Self-pay | Admitting: Physician Assistant

## 2022-10-12 VITALS — BP 151/72 | HR 85 | Ht 67.0 in | Wt 216.0 lb

## 2022-10-12 DIAGNOSIS — Z794 Long term (current) use of insulin: Secondary | ICD-10-CM

## 2022-10-12 DIAGNOSIS — E1169 Type 2 diabetes mellitus with other specified complication: Secondary | ICD-10-CM

## 2022-10-12 DIAGNOSIS — I25810 Atherosclerosis of coronary artery bypass graft(s) without angina pectoris: Secondary | ICD-10-CM | POA: Diagnosis not present

## 2022-10-12 DIAGNOSIS — E6609 Other obesity due to excess calories: Secondary | ICD-10-CM

## 2022-10-12 DIAGNOSIS — I10 Essential (primary) hypertension: Secondary | ICD-10-CM

## 2022-10-12 DIAGNOSIS — Z6833 Body mass index (BMI) 33.0-33.9, adult: Secondary | ICD-10-CM

## 2022-10-12 DIAGNOSIS — F419 Anxiety disorder, unspecified: Secondary | ICD-10-CM

## 2022-10-12 DIAGNOSIS — E119 Type 2 diabetes mellitus without complications: Secondary | ICD-10-CM

## 2022-10-12 DIAGNOSIS — I2510 Atherosclerotic heart disease of native coronary artery without angina pectoris: Secondary | ICD-10-CM | POA: Diagnosis not present

## 2022-10-12 MED ORDER — ALPRAZOLAM 0.5 MG PO TABS: 0.5000 mg | ORAL_TABLET | Freq: Every evening | ORAL | 2 refills | Status: DC | PRN

## 2022-10-12 MED ORDER — TRULICITY 1.5 MG/0.5ML ~~LOC~~ SOAJ: 1.5000 mg | SUBCUTANEOUS | 0 refills | Status: DC

## 2022-10-12 MED ORDER — ATORVASTATIN CALCIUM 40 MG PO TABS: 40.0000 mg | ORAL_TABLET | Freq: Every day | ORAL | 1 refills | Status: DC

## 2022-10-12 MED ORDER — AMLODIPINE BESYLATE 10 MG PO TABS: 10.0000 mg | ORAL_TABLET | Freq: Every day | ORAL | 1 refills | Status: DC

## 2022-10-12 NOTE — Patient Instructions (Addendum)
Take 2 metoprolol '50mg'$  daily.  4weeks

## 2022-10-20 NOTE — Progress Notes (Signed)
Established Patient Office Visit  Subjective   Patient ID: Denise Tyler, female    DOB: 1966-02-21  Age: 56 y.o. MRN: [id number]  Chief Complaint  Patient presents with   Follow-up    diabetes    HPI Pt is a 56 yo obese female with T2DM, CAD, HTN, HLD who presents to the clinic for follow up.    Pt was recently in ED and admitted for ST elevation and CAD. No stent was needed and no treatable cause for ST elevation and chest pain. Pt is not checking her sugars regularly. Darcel Bayley was declined by [facility]. She remains on trulicity and metformin. She did quite smoking in hospital but has started back some. She has patches for smoking cessation. She is on lipitor.   .. Active Ambulatory Problems    Diagnosis Date Noted   ANKLE PAIN 02/02/2011   GERD (gastroesophageal reflux disease) 11/08/2013   Epigastric pain 11/08/2013   Uncontrolled diabetes mellitus ([place]) 11/08/2013   Hepatic steatosis 11/27/2014   Adenoma of left adrenal gland 11/27/2014   Constipation 11/27/2014   Type 2 diabetes mellitus with hyperglycemia, with long-term current use of insulin ([place]) 01/27/2016   Onychomycosis 01/27/2016   CVA tenderness 03/30/2016   GAD (generalized anxiety disorder) 12/25/2016   Insomnia 12/25/2016   Polyarthralgia 12/25/2016   Hypertriglyceridemia 01/01/2017   Right hip pain 07/11/2017   Hyperlipidemia associated with type 2 diabetes mellitus ([place]) 07/13/2017   Primary osteoarthritis of right hip 07/13/2017   Trochanteric bursitis of right hip 01/06/2018   Right knee pain 01/06/2018   No energy 01/30/2018   Chronic pain of both knees 01/30/2018   Tobacco abuse 12/05/2018   Tobacco dependence 12/25/2019   Right upper quadrant abdominal pain 01/30/2020   Depression, major, single episode, mild ([place]) 02/06/2020   Degenerative arthritis of thumb, left 11/05/2020   Degenerative arthritis of thumb, right 11/05/2020   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[place]) 11/05/2020   Non  compliance w medication regimen 11/05/2020   Bilateral thumb pain 11/05/2020   Anxiety 11/05/2020   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-sided sciatica 11/05/2020   Coronary artery disease involving coronary bypass graft of native heart without angina pectoris 02/19/2021   Neuropathy due to secondary diabetes ([place]) 02/19/2021   Gross hematuria 02/19/2021   Diabetes mellitus with hemoglobin A1c goal of 7.0%-8.0% ([place]) 02/19/2021   Depressed mood 02/19/2021   NSTEMI (non-ST elevated myocardial infarction) ([place]) 02/19/2021   Class 1 obesity due to excess calories with serious comorbidity and body mass index (BMI) of 33.0 to 33.9 in adult 02/19/2021   PTSD (post-traumatic stress disorder) 02/19/2021   Stress 10/03/2021   Chronic left shoulder pain 04/01/2022   CAD, multiple vessel 09/16/2022   Primary hypertension 09/16/2022   Resolved Ambulatory Problems    Diagnosis Date Noted   Black tarry stools 03/30/2016   Past Medical History:  Diagnosis Date   Diabetes mellitus without complication ([place])    Mini stroke     ROS See HPI.    Objective:     BP (!) 151/72 (BP Location: Left Arm, Patient Position: Sitting, Cuff Size: Large)   Pulse 85   Ht '5\' 7"'$  (1.702 m)   Wt 216 lb 0.6 oz (98 kg)   SpO2 99%   BMI 33.84 kg/m  BP Readings from Last 3 Encounters:  10/12/22 (!) 151/72  09/16/22 127/89  06/30/22 (!) 155/83   Wt Readings from Last 3 Encounters:  10/12/22 216 lb 0.6 oz (98 kg)  09/16/22 213 lb (96.6 kg)  06/30/22 213 lb 1.9 oz (96.7 kg)    .[name] Kitchen Results for orders placed or performed in visit on 06/30/22  POCT glycosylated hemoglobin (Hb A1C)  Result Value Ref Range   Hemoglobin A1C 7.0 (A) 4.0 - 5.6 %   HbA1c POC (<> result, manual entry)     HbA1c, POC (prediabetic range)     HbA1c, POC (controlled diabetic range)    POCT UA - Microalbumin  Result Value Ref Range   Microalbumin Ur, POC 30 mg/L   Creatinine, POC 300 mg/dL   Albumin/Creatinine Ratio, Urine,  POC 30       Physical Exam Constitutional:      Appearance: Normal appearance. [name].  HENT:     Head: Normocephalic.  Neck:     Vascular: No carotid bruit.  Cardiovascular:     Rate and Rhythm: Normal rate and regular rhythm.  Pulmonary:     Effort: Pulmonary effort is normal.     Breath sounds: Normal breath sounds.  Musculoskeletal:     Cervical back: Normal range of motion.     Right lower leg: No edema.     Left lower leg: No edema.  Lymphadenopathy:     Cervical: No cervical adenopathy.  Neurological:     Mental Status: She is alert.  Psychiatric:        Mood and Affect: Mood normal.          Assessment & Plan:  [name] KitchenMarland KitchenMurielle was seen today for follow-up.  Diagnoses and all orders for this visit:  Type 2 diabetes mellitus with other specified complication, with long-term current use of insulin (HCC) -     atorvastatin (LIPITOR) 40 MG tablet; Take 1 tablet (40 mg total) by mouth daily. -     Dulaglutide (TRULICITY) 1.5 KZ/6.0FU SOPN; Inject 1.5 mg into the skin once a week.  Primary hypertension -     amLODipine (NORVASC) 10 MG tablet; Take 1 tablet (10 mg total) by mouth daily.  CAD, multiple vessel -     amLODipine (NORVASC) 10 MG tablet; Take 1 tablet (10 mg total) by mouth daily. -     Dulaglutide (TRULICITY) 1.5 XN/2.3FT SOPN; Inject 1.5 mg into the skin once a week.  Coronary artery disease involving coronary bypass graft of native heart without angina pectoris -     atorvastatin (LIPITOR) 40 MG tablet; Take 1 tablet (40 mg total) by mouth daily.  Anxiety -     ALPRAZolam (XANAX) 0.5 MG tablet; Take 1 tablet (0.5 mg total) by mouth at bedtime as needed for anxiety.  Class 1 obesity due to excess calories with serious comorbidity and body mass index (BMI) of 33.0 to 33.9 in adult -     Dulaglutide (TRULICITY) 1.5 DD/2.2GU SOPN; Inject 1.5 mg into the skin once a week.  Other orders -     Discontinue: Dulaglutide (TRULICITY) 1.5 RK/2.7CW SOPN;  Inject 1.5 mg into the skin once a week.   A1C just in goal range but would love for it to be under 7.0 Increased trulicity to 1.'5mg'$  weekly  Continue metformin BP not to goal continue norvasc and increase metoprolol to 2 '50mg'$  a day.  Follow up with BP in 1 month On statin Negative for microalbumin Declined covid/flu/pneumonia/shingles vaccines Recheck A1C in 3 months  Reminder of importance of not smoking  Return in about 3 months (around 01/11/2023) for nurse visit BP check in 4 weeks.    Iran Planas, PA-C

## 2022-11-10 ENCOUNTER — Telehealth: Payer: Self-pay

## 2022-11-10 ENCOUNTER — Ambulatory Visit: Payer: No Typology Code available for payment source | Admitting: Physician Assistant

## 2022-11-10 VITALS — BP 155/79 | HR 84 | Ht 67.0 in

## 2022-11-10 DIAGNOSIS — I10 Essential (primary) hypertension: Secondary | ICD-10-CM | POA: Diagnosis not present

## 2022-11-10 NOTE — Telephone Encounter (Signed)
Hi Denise Tyler,   In speaking with this patient today she is having trouble with getting Trulicity at pharmacy. Can you check into this for her?  Thank you so much! Blima Singer

## 2022-11-10 NOTE — Progress Notes (Signed)
   Established Patient Office Visit  Subjective   Patient ID: Velna Hedgecock, female    DOB: 1966-05-21  Age: 57 y.o. MRN: [id number]  Chief Complaint  Patient presents with   Hypertension    Nurse visit for BP check - patient states she is not 100% sure which medications she has been taking. She states she  will verify her medications through Mychart and will let us know what she is taking via Mychart.     HPI  Hypertension- nurse visit BP check -  patient  initial reading is 151/70 and second reading is 155/79. Patient is not entirely sure about the medications she is taking for BP - she will  check her Mychart and review her medications and will let us know for certain what BP Medications she is currently taking via Mychart.   ROS    Objective:     BP (!) 155/79   Pulse 84   Ht '5\' 7"'$  (1.702 m)   SpO2 100%   BMI 33.84 kg/m    Physical Exam   No results found for any visits on 11/10/22.    The ASCVD Risk score (Arnett DK, et al., 2019) failed to calculate for the following reasons:   The patient has a prior MI or stroke diagnosis    Assessment & Plan:  Hypertension- nurse visit BP check -  per Iran Planas, PA - she will await patient response regarding current medication use and  will then adjust medications accordingly.  Patient instructed to return in 2 weeks for  nurse visit BP check again.  Problem List Items Addressed This Visit   None   Return in about 2 weeks (around 11/24/2022) for nurse visit for BP check .    Rae Lips, LPN

## 2022-11-11 NOTE — Progress Notes (Signed)
BP not to goal and would like to make medication changes. Pt is not entirely sure what medications she is taking. She wants to go home and report back exactly what she is taking and doses before we make adjustments.   -[name]

## 2022-11-16 ENCOUNTER — Telehealth: Payer: Self-pay

## 2022-11-16 NOTE — Telephone Encounter (Addendum)
Initiated Prior authorization [place]:[id number] pen-injectors Via: Covermymeds Case/Key:[id number] Status: approved  as of 11/16/22 Reason:authorization Expiration Date: 11/16/2023 Notified Pt via: Mychart

## 2022-11-24 ENCOUNTER — Ambulatory Visit: Payer: No Typology Code available for payment source

## 2022-11-30 ENCOUNTER — Ambulatory Visit (INDEPENDENT_AMBULATORY_CARE_PROVIDER_SITE_OTHER): Payer: No Typology Code available for payment source | Admitting: Physician Assistant

## 2022-11-30 ENCOUNTER — Encounter: Payer: Self-pay | Admitting: Physician Assistant

## 2022-11-30 VITALS — BP 149/84 | HR 84 | Ht 67.0 in | Wt 217.0 lb

## 2022-11-30 DIAGNOSIS — I25810 Atherosclerosis of coronary artery bypass graft(s) without angina pectoris: Secondary | ICD-10-CM

## 2022-11-30 DIAGNOSIS — Z6833 Body mass index (BMI) 33.0-33.9, adult: Secondary | ICD-10-CM

## 2022-11-30 DIAGNOSIS — Z716 Tobacco abuse counseling: Secondary | ICD-10-CM

## 2022-11-30 DIAGNOSIS — Z794 Long term (current) use of insulin: Secondary | ICD-10-CM | POA: Diagnosis not present

## 2022-11-30 DIAGNOSIS — I2510 Atherosclerotic heart disease of native coronary artery without angina pectoris: Secondary | ICD-10-CM

## 2022-11-30 DIAGNOSIS — I10 Essential (primary) hypertension: Secondary | ICD-10-CM | POA: Diagnosis not present

## 2022-11-30 DIAGNOSIS — E1165 Type 2 diabetes mellitus with hyperglycemia: Secondary | ICD-10-CM | POA: Diagnosis not present

## 2022-11-30 DIAGNOSIS — E1169 Type 2 diabetes mellitus with other specified complication: Secondary | ICD-10-CM

## 2022-11-30 DIAGNOSIS — E6609 Other obesity due to excess calories: Secondary | ICD-10-CM

## 2022-11-30 LAB — POCT GLYCOSYLATED HEMOGLOBIN (HGB A1C): Hemoglobin A1C: 9.8 % — AB (ref 4.0–5.6)

## 2022-11-30 MED ORDER — CLOPIDOGREL BISULFATE 75 MG PO TABS: 75.0000 mg | ORAL_TABLET | Freq: Every day | ORAL | 0 refills | Status: DC

## 2022-11-30 MED ORDER — METOPROLOL TARTRATE 25 MG PO TABS: 25.0000 mg | ORAL_TABLET | Freq: Two times a day (BID) | ORAL | 1 refills | Status: DC

## 2022-11-30 MED ORDER — AMLODIPINE BESYLATE 10 MG PO TABS: 10.0000 mg | ORAL_TABLET | Freq: Every day | ORAL | 1 refills | Status: DC

## 2022-11-30 MED ORDER — NICOTINE 21 MG/24HR TD PT24: 21.0000 mg | MEDICATED_PATCH | Freq: Every day | TRANSDERMAL | 2 refills | Status: AC

## 2022-11-30 NOTE — Progress Notes (Signed)
   Established Patient Office Visit  Subjective   Patient ID: Denise Tyler, female    DOB: [birth year]  Age: 57 y.o. MRN: [id number]  Chief Complaint  Patient presents with  . Follow-up    HPI  Norvasc '5mg'$   Not taking   Mini coake a day   140   180-210  {History (Optional):23778}  ROS    Objective:     BP (!) 149/84   Pulse 84   Ht '5\' 7"'$  (1.702 m)   Wt 217 lb (98.4 kg)   SpO2 97%   BMI 33.99 kg/m  BP Readings from Last 3 Encounters:  11/30/22 (!) 149/84  11/10/22 (!) 155/79  10/12/22 (!) 151/72   Wt Readings from Last 3 Encounters:  11/30/22 217 lb (98.4 kg)  10/12/22 216 lb 0.6 oz (98 kg)  09/16/22 213 lb (96.6 kg)    .[name] Kitchen Results for orders placed or performed in visit on 06/30/22  POCT glycosylated hemoglobin (Hb A1C)  Result Value Ref Range   Hemoglobin A1C 7.0 (A) 4.0 - 5.6 %   HbA1c POC (<> result, manual entry)     HbA1c, POC (prediabetic range)     HbA1c, POC (controlled diabetic range)    POCT UA - Microalbumin  Result Value Ref Range   Microalbumin Ur, POC 30 mg/L   Creatinine, POC 300 mg/dL   Albumin/Creatinine Ratio, Urine, POC 30      Physical Exam       Assessment & Plan:   Problem List Items Addressed This Visit       Unprioritized   Coronary artery disease involving coronary bypass graft of native heart without angina pectoris    No follow-ups on file.    Iran Planas, PA-C

## 2022-11-30 NOTE — Patient Instructions (Signed)
Increase norvasc to '10mg'$  daily for BP.  Continue trulicity 1.'5mg'$  weekly.  Continue to cut back on smoking.

## 2022-12-01 ENCOUNTER — Encounter: Payer: Self-pay | Admitting: Physician Assistant

## 2022-12-01 MED ORDER — TRULICITY 1.5 MG/0.5ML ~~LOC~~ SOAJ: 1.5000 mg | SUBCUTANEOUS | 0 refills | Status: DC

## 2022-12-07 ENCOUNTER — Other Ambulatory Visit: Payer: Self-pay

## 2022-12-07 DIAGNOSIS — Z87891 Personal history of nicotine dependence: Secondary | ICD-10-CM

## 2022-12-07 DIAGNOSIS — F17210 Nicotine dependence, cigarettes, uncomplicated: Secondary | ICD-10-CM

## 2022-12-17 ENCOUNTER — Encounter: Payer: Self-pay | Admitting: Pulmonary Disease

## 2022-12-17 ENCOUNTER — Ambulatory Visit: Payer: No Typology Code available for payment source | Admitting: Pulmonary Disease

## 2022-12-17 DIAGNOSIS — F17210 Nicotine dependence, cigarettes, uncomplicated: Secondary | ICD-10-CM

## 2022-12-17 DIAGNOSIS — Z87891 Personal history of nicotine dependence: Secondary | ICD-10-CM

## 2022-12-17 DIAGNOSIS — Z122 Encounter for screening for malignant neoplasm of respiratory organs: Secondary | ICD-10-CM

## 2022-12-17 NOTE — Progress Notes (Addendum)
Virtual Visit via Telephone Note  I connected with Denise Tyler on 12/17/22 at 12 PM EST by telephone and verified that I am speaking with the correct person using two identifiers.  Location: Patient: Home Provider: [address] [place] [address]     Shared Decision Making Visit Lung Cancer Screening Program [phone])   Eligibility: Age 57 y.o. Pack Years Smoking History Calculation 41 (# packs/per year x # years smoked) Recent History of coughing up blood  no Unexplained weight loss? no ( >Than 15 pounds within the last 6 months ) Prior History Lung / other cancer yes (Diagnosis within the last 5 years already requiring surveillance chest CT Scans). Smoking Status Current Smoker - 1 ppd   Visit Components: Discussion included one or more decision making aids. yes Discussion included risk/benefits of screening. yes Discussion included potential follow up diagnostic testing for abnormal scans. yes Discussion included meaning and risk of over diagnosis. yes Discussion included meaning and risk of False Positives. yes Discussion included meaning of total radiation exposure. yes  Counseling Included: Importance of adherence to annual lung cancer LDCT screening. yes Impact of comorbidities on ability to participate in the program. yes Ability and willingness to under diagnostic treatment. yes  Smoking Cessation Counseling: Current Smokers:  Discussed importance of smoking cessation. yes Information about tobacco cessation classes and interventions provided to patient. yes Patient provided with "ticket" for LDCT Scan. yes Diagnosis Code: Tobacco Use Z72.0 Asymptomatic Patient yes  Counseling (Intermediate counseling: > three minutes counseling) [id number] Former Smokers:  Discussed the importance of maintaining cigarette abstinence. yes Diagnosis Code: Personal History of Nicotine Dependence. [id number] Information about tobacco cessation classes and  interventions provided to patient. Yes Patient provided with "ticket" for LDCT Scan. yes Written Order for Lung Cancer Screening with LDCT placed in Epic. Yes (CT Chest Lung Cancer Screening Low Dose W/O CM) [id number] Z12.2-Screening of respiratory organs Z87.891-Personal history of nicotine dependence   Lauraine Rinne, NP

## 2022-12-17 NOTE — Patient Instructions (Signed)
Thank you for participating in the [facility]. It was our pleasure to meet you today. We will call you with the results of your scan within the next few days. Your scan will be assigned a Lung RADS category score by the physicians reading the scans.  This Lung RADS score determines follow up scanning.  See below for description of categories, and follow up screening recommendations. We will be in touch to schedule your follow up screening annually or based on recommendations of our providers. We will fax a copy of your scan results to your Primary Care Physician, or the physician who referred you to the program, to ensure they have the results. Please call the office if you have any questions or concerns regarding your scanning experience or results.  Our office number is [phone]. Please speak with Denise Phelps, RN. , or  Denise Buckner RN, They are  our Lung Cancer Screening RN.'s If They are unavailable when you call, Please leave a message on the voice mail. We will return your call at our earliest convenience.This voice mail is monitored several times a day.  Remember, if your scan is normal, we will scan you annually as long as you continue to meet the criteria for the program. (Age 50-80, Current smoker or smoker who has quit within the last 15 years). If you are a smoker, remember, quitting is the single most powerful action that you can take to decrease your risk of lung cancer and other pulmonary, breathing related problems. We know quitting is hard, and we are here to help.  Please let us know if there is anything we can do to help you meet your goal of quitting. If you are a former smoker, congratulations. We are proud of you! Remain smoke free! Remember you can refer friends or family members through the number above.  We will screen them to make sure they meet criteria for the program. Thank you for helping us take better care of you by  participating in Lung Screening.  You can receive free nicotine replacement therapy ( patches, gum or mints) by calling 1-800-QUIT NOW. Please call so we can get you on the path to becoming  a non-smoker. I know it is hard, but you can do this!  Lung RADS Categories:  Lung RADS 1: no nodules or definitely non-concerning nodules.  Recommendation is for a repeat annual scan in 12 months.  Lung RADS 2:  nodules that are non-concerning in appearance and behavior with a very low likelihood of becoming an active cancer. Recommendation is for a repeat annual scan in 12 months.  Lung RADS 3: nodules that are probably non-concerning , includes nodules with a low likelihood of becoming an active cancer.  Recommendation is for a 6-month repeat screening scan. Often noted after an upper respiratory illness. We will be in touch to make sure you have no questions, and to schedule your 6-month scan.  Lung RADS 4 A: nodules with concerning findings, recommendation is most often for a follow up scan in 3 months or additional testing based on our provider's assessment of the scan. We will be in touch to make sure you have no questions and to schedule the recommended 3 month follow up scan.  Lung RADS 4 B:  indicates findings that are concerning. We will be in touch with you to schedule additional diagnostic testing based on our provider's  assessment of the scan.  Other options for assistance in smoking cessation (   As covered by your insurance benefits)  Hypnosis for smoking cessation  [employer] [phone]  Acupuncture for smoking cessation  [facility] [phone]   

## 2022-12-18 ENCOUNTER — Ambulatory Visit: Payer: No Typology Code available for payment source | Admitting: Physician Assistant

## 2022-12-18 ENCOUNTER — Ambulatory Visit (INDEPENDENT_AMBULATORY_CARE_PROVIDER_SITE_OTHER): Payer: No Typology Code available for payment source

## 2022-12-18 DIAGNOSIS — F17210 Nicotine dependence, cigarettes, uncomplicated: Secondary | ICD-10-CM | POA: Diagnosis not present

## 2022-12-18 DIAGNOSIS — Z87891 Personal history of nicotine dependence: Secondary | ICD-10-CM

## 2022-12-21 ENCOUNTER — Other Ambulatory Visit: Payer: Self-pay | Admitting: Acute Care

## 2022-12-21 DIAGNOSIS — Z87891 Personal history of nicotine dependence: Secondary | ICD-10-CM

## 2022-12-21 DIAGNOSIS — Z122 Encounter for screening for malignant neoplasm of respiratory organs: Secondary | ICD-10-CM

## 2022-12-21 DIAGNOSIS — F17210 Nicotine dependence, cigarettes, uncomplicated: Secondary | ICD-10-CM

## 2023-01-05 ENCOUNTER — Encounter: Payer: Self-pay | Admitting: Physician Assistant

## 2023-01-05 DIAGNOSIS — J449 Chronic obstructive pulmonary disease, unspecified: Secondary | ICD-10-CM | POA: Insufficient documentation

## 2023-01-07 ENCOUNTER — Telehealth: Payer: Self-pay | Admitting: Neurology

## 2023-01-07 NOTE — Telephone Encounter (Signed)
-----   Message from Donella Stade, [place] sent at 01/05/2023  4:07 PM EDT ----- COPD noted.  No acute findings.  Seen coronary plaque. Follow up yearly.  ----- Message ----- From: Christie Beckers, RN Sent: 12/21/2022   8:43 AM EDT To: Donella Stade, PA-C

## 2023-01-07 NOTE — Telephone Encounter (Signed)
LVM for patient to call back for CT Lung Screening results.

## 2023-01-11 NOTE — Telephone Encounter (Signed)
Patient made aware of results.  

## 2023-01-12 ENCOUNTER — Ambulatory Visit: Payer: No Typology Code available for payment source | Admitting: Physician Assistant

## 2023-02-17 ENCOUNTER — Other Ambulatory Visit: Payer: Self-pay | Admitting: Physician Assistant

## 2023-02-17 DIAGNOSIS — Z794 Long term (current) use of insulin: Secondary | ICD-10-CM

## 2023-02-17 DIAGNOSIS — I2510 Atherosclerotic heart disease of native coronary artery without angina pectoris: Secondary | ICD-10-CM

## 2023-02-17 DIAGNOSIS — E6609 Other obesity due to excess calories: Secondary | ICD-10-CM

## 2023-03-01 ENCOUNTER — Ambulatory Visit: Payer: No Typology Code available for payment source | Admitting: Physician Assistant

## 2023-04-09 ENCOUNTER — Ambulatory Visit (INDEPENDENT_AMBULATORY_CARE_PROVIDER_SITE_OTHER): Payer: No Typology Code available for payment source | Admitting: Physician Assistant

## 2023-04-09 ENCOUNTER — Encounter: Payer: Self-pay | Admitting: Physician Assistant

## 2023-04-09 VITALS — BP 169/89 | HR 97 | Ht 67.0 in | Wt 209.0 lb

## 2023-04-09 DIAGNOSIS — E1165 Type 2 diabetes mellitus with hyperglycemia: Secondary | ICD-10-CM

## 2023-04-09 DIAGNOSIS — F4323 Adjustment disorder with mixed anxiety and depressed mood: Secondary | ICD-10-CM | POA: Diagnosis not present

## 2023-04-09 DIAGNOSIS — Z794 Long term (current) use of insulin: Secondary | ICD-10-CM | POA: Diagnosis not present

## 2023-04-09 DIAGNOSIS — F419 Anxiety disorder, unspecified: Secondary | ICD-10-CM | POA: Diagnosis not present

## 2023-04-09 DIAGNOSIS — I10 Essential (primary) hypertension: Secondary | ICD-10-CM

## 2023-04-09 LAB — POCT GLYCOSYLATED HEMOGLOBIN (HGB A1C): Hemoglobin A1C: 6.7 % — AB (ref 4.0–5.6)

## 2023-04-09 MED ORDER — ALPRAZOLAM 0.5 MG PO TABS: 0.5000 mg | ORAL_TABLET | Freq: Two times a day (BID) | ORAL | 0 refills | Status: DC | PRN

## 2023-04-09 MED ORDER — BUSPIRONE HCL 7.5 MG PO TABS: 7.5000 mg | ORAL_TABLET | Freq: Three times a day (TID) | ORAL | 1 refills | Status: DC
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
## 2023-04-14 ENCOUNTER — Encounter: Payer: Self-pay | Admitting: Physician Assistant

## 2023-05-10 ENCOUNTER — Ambulatory Visit: Payer: No Typology Code available for payment source | Admitting: Physician Assistant

## 2023-05-31 ENCOUNTER — Ambulatory Visit: Payer: No Typology Code available for payment source | Admitting: Family Medicine

## 2023-06-01 ENCOUNTER — Ambulatory Visit: Payer: No Typology Code available for payment source

## 2023-06-01 ENCOUNTER — Encounter: Payer: Self-pay | Admitting: Medical-Surgical

## 2023-06-01 ENCOUNTER — Ambulatory Visit (INDEPENDENT_AMBULATORY_CARE_PROVIDER_SITE_OTHER): Payer: No Typology Code available for payment source | Admitting: Medical-Surgical

## 2023-06-01 VITALS — BP 136/94 | HR 83 | Temp 98.6°F | Ht 67.0 in | Wt 205.1 lb

## 2023-06-01 DIAGNOSIS — M5441 Lumbago with sciatica, right side: Secondary | ICD-10-CM | POA: Diagnosis not present

## 2023-06-01 DIAGNOSIS — R509 Fever, unspecified: Secondary | ICD-10-CM | POA: Diagnosis not present

## 2023-06-01 DIAGNOSIS — R071 Chest pain on breathing: Secondary | ICD-10-CM

## 2023-06-01 DIAGNOSIS — G8929 Other chronic pain: Secondary | ICD-10-CM

## 2023-06-01 DIAGNOSIS — M546 Pain in thoracic spine: Secondary | ICD-10-CM

## 2023-06-01 LAB — POCT URINALYSIS DIP (CLINITEK)
Bilirubin, UA: NEGATIVE
Blood, UA: NEGATIVE
Glucose, UA: NEGATIVE mg/dL
Ketones, POC UA: NEGATIVE mg/dL
Leukocytes, UA: NEGATIVE
Nitrite, UA: NEGATIVE
POC PROTEIN,UA: NEGATIVE
Spec Grav, UA: 1.025 (ref 1.010–1.025)
Urobilinogen, UA: 0.2 E.U./dL
pH, UA: 6 (ref 5.0–8.0)

## 2023-06-01 LAB — POCT INFLUENZA A/B
Influenza A, POC: NEGATIVE
Influenza B, POC: NEGATIVE

## 2023-06-01 MED ORDER — CYCLOBENZAPRINE HCL 10 MG PO TABS
10.0000 mg | ORAL_TABLET | Freq: Three times a day (TID) | ORAL | 1 refills | Status: DC | PRN
Start: 2023-06-01 — End: 2023-11-17

## 2023-06-01 NOTE — Progress Notes (Unsigned)
        Established patient visit  History, exam, impression, and plan:  1. Fever, unspecified fever cause 2. Chest pain on breathing Pleasant 57 year old female presenting today with reports of a fever of 100 along with chills and foot swelling that started yesterday.  Has had a moderate of other symptoms and reports that she has been feeling awful for the last several days.  Has had some back pain as noted below as well as a strong headache.  Tested for COVID last night with negative results.  Has been sneezing and has also started coughing.  Has pain in her thoracic spine and ribs when taking a deep breath.  Also notes that she has had a little bit of brain fog over the last few days.  Unclear etiology and exam not revealing.  Checking labs as below.  POCT flu negative.  POCT urinalysis normal.  Recommend conservative management with Tylenol/ibuprofen for discomfort.  Increase rest and fluids.  Suspect viral etiology. - CBC with Differential/Platelet - Comprehensive metabolic panel - POCT Influenza A/B - POCT URINALYSIS DIP (CLINITEK)  3. Acute bilateral thoracic back pain Has been suffering with back pain since Friday at bedtime.  She ended up taking a 20-minute nap and went to the kitchen after waking.  While there, she developed a severe/intense pain along the bra strap line bilaterally in the thoracic spine.  Checked her blood pressure which was okay.  Decided to take a shower and notes that the pain at that time was rated 10/10 and felt sharp and stabbing.  On Saturday, the pain continued and she took a muscle relaxer that she had on hand which was somewhat helpful.  As noted above, pain with deep breathing.  Given chest x-ray today.  Tenderness and discomfort reproducible on palpation of the musculature in the area noted.  Feel this is related to MSK rather than pulmonary concern.  Refilling Flexeril for as needed use.  Tylenol/ibuprofen as noted above. - DG Chest 2 View; Future -  cyclobenzaprine (FLEXERIL) 10 MG tablet; Take 1 tablet (10 mg total) by mouth 3 (three) times daily as needed for muscle spasms.  Dispense: 30 tablet; Refill: 1  Procedures performed this visit: None.  Return if symptoms worsen or fail to improve.  __________________________________ Thayer Ohm, DNP, APRN, FNP-BC Primary Care and Sports Medicine [facility] [place]

## 2023-06-02 ENCOUNTER — Encounter: Payer: Self-pay | Admitting: Medical-Surgical

## 2023-06-04 ENCOUNTER — Encounter: Payer: Self-pay | Admitting: Medical-Surgical

## 2023-06-04 ENCOUNTER — Other Ambulatory Visit: Payer: Self-pay | Admitting: Medical-Surgical

## 2023-06-04 DIAGNOSIS — J189 Pneumonia, unspecified organism: Secondary | ICD-10-CM

## 2023-06-04 MED ORDER — AMOXICILLIN-POT CLAVULANATE 875-125 MG PO TABS: 1.0000 | ORAL_TABLET | Freq: Two times a day (BID) | ORAL | 0 refills | Status: DC

## 2023-06-04 MED ORDER — AZITHROMYCIN 250 MG PO TABS: ORAL_TABLET | ORAL | 0 refills | Status: AC

## 2023-06-04 NOTE — Telephone Encounter (Signed)
I called and now awaiting the read.

## 2023-07-21 ENCOUNTER — Telehealth: Payer: Self-pay | Admitting: *Deleted

## 2023-07-21 NOTE — Telephone Encounter (Signed)
LVM for pt to call office to schedule follow up appointment.  Looks like pt had a 4 wk f/u from last visit that she missed and then was supposed to follow up in 3 months for continuity. Also received a Consider AntiHypertensive Therapy form from [place].  Please assist in getting patient scheduled.

## 2023-08-17 ENCOUNTER — Encounter: Payer: Self-pay | Admitting: Physician Assistant

## 2023-08-17 ENCOUNTER — Ambulatory Visit (INDEPENDENT_AMBULATORY_CARE_PROVIDER_SITE_OTHER): Payer: No Typology Code available for payment source | Admitting: Physician Assistant

## 2023-08-17 VITALS — BP 155/82 | HR 82 | Ht 67.0 in | Wt 210.0 lb

## 2023-08-17 DIAGNOSIS — F17200 Nicotine dependence, unspecified, uncomplicated: Secondary | ICD-10-CM

## 2023-08-17 DIAGNOSIS — F419 Anxiety disorder, unspecified: Secondary | ICD-10-CM

## 2023-08-17 DIAGNOSIS — E6609 Other obesity due to excess calories: Secondary | ICD-10-CM

## 2023-08-17 DIAGNOSIS — I10 Essential (primary) hypertension: Secondary | ICD-10-CM | POA: Diagnosis not present

## 2023-08-17 DIAGNOSIS — I25810 Atherosclerosis of coronary artery bypass graft(s) without angina pectoris: Secondary | ICD-10-CM

## 2023-08-17 DIAGNOSIS — Z7984 Long term (current) use of oral hypoglycemic drugs: Secondary | ICD-10-CM

## 2023-08-17 DIAGNOSIS — Z6833 Body mass index (BMI) 33.0-33.9, adult: Secondary | ICD-10-CM

## 2023-08-17 DIAGNOSIS — E1340 Other specified diabetes mellitus with diabetic neuropathy, unspecified: Secondary | ICD-10-CM

## 2023-08-17 DIAGNOSIS — I2510 Atherosclerotic heart disease of native coronary artery without angina pectoris: Secondary | ICD-10-CM | POA: Diagnosis not present

## 2023-08-17 DIAGNOSIS — E66811 Obesity, class 1: Secondary | ICD-10-CM | POA: Diagnosis not present

## 2023-08-17 DIAGNOSIS — E119 Type 2 diabetes mellitus without complications: Secondary | ICD-10-CM

## 2023-08-17 DIAGNOSIS — E1165 Type 2 diabetes mellitus with hyperglycemia: Secondary | ICD-10-CM

## 2023-08-17 DIAGNOSIS — R809 Proteinuria, unspecified: Secondary | ICD-10-CM | POA: Insufficient documentation

## 2023-08-17 DIAGNOSIS — Z794 Long term (current) use of insulin: Secondary | ICD-10-CM

## 2023-08-17 DIAGNOSIS — F4323 Adjustment disorder with mixed anxiety and depressed mood: Secondary | ICD-10-CM

## 2023-08-17 LAB — POCT UA - MICROALBUMIN
Creatinine, POC: 50 mg/dL
Microalbumin Ur, POC: 10 mg/L

## 2023-08-17 LAB — POCT GLYCOSYLATED HEMOGLOBIN (HGB A1C): Hemoglobin A1C: 9.1 % — AB (ref 4.0–5.6)

## 2023-08-17 MED ORDER — AMLODIPINE BESYLATE 10 MG PO TABS: 10.0000 mg | ORAL_TABLET | Freq: Every day | ORAL | 1 refills | Status: DC

## 2023-08-17 MED ORDER — TRULICITY 3 MG/0.5ML ~~LOC~~ SOAJ: 3.0000 mg | SUBCUTANEOUS | 1 refills | Status: DC

## 2023-08-17 MED ORDER — BASAGLAR TEMPO PEN 100 UNIT/ML ~~LOC~~ SOPN: PEN_INJECTOR | SUBCUTANEOUS | 0 refills | Status: DC

## 2023-08-17 MED ORDER — METOPROLOL TARTRATE 25 MG PO TABS: 25.0000 mg | ORAL_TABLET | Freq: Two times a day (BID) | ORAL | 1 refills | Status: DC

## 2023-08-17 MED ORDER — ATORVASTATIN CALCIUM 40 MG PO TABS: 40.0000 mg | ORAL_TABLET | Freq: Every day | ORAL | 1 refills | Status: DC

## 2023-08-17 MED ORDER — LOSARTAN POTASSIUM 25 MG PO TABS: 25.0000 mg | ORAL_TABLET | Freq: Every day | ORAL | 1 refills | Status: DC

## 2023-08-17 MED ORDER — METFORMIN HCL ER 500 MG PO TB24: 1000.0000 mg | ORAL_TABLET | Freq: Every day | ORAL | 1 refills | Status: DC

## 2023-08-17 NOTE — Patient Instructions (Addendum)
A1C goal: under 7 LDL goal: under 70 BP goal: under 130/80 Goal fasting is under 130.   Staying on your beta blocker(metoprolol), statin(lipitor)  Start Basaglar 10 units at bedtime. Increase by 2 units every 5 days until you get to a fasting sugar under 130.

## 2023-08-17 NOTE — Progress Notes (Unsigned)
Established Patient Office Visit  Subjective   Patient ID: Denise Tyler, female    DOB: [birth year]  Age: 57 y.o. MRN: [id number]  Chief Complaint  Patient presents with   Medical Management of Chronic Issues    9 mo fup on d/m last A1c is 9.8    HPI Pt is a 57 yo obese female with T2DM, CAD, hx of MI, HTN, Anxiety who presents to the clinic for follow up.   She is not checking her sugars. She admits she is not compliant with medications. She has not been taking metformin or metoprolol. She has used trulicity but not for the last month. She is concerned about cost of medication because she is not going to have insurance soon after she quit her job. She has intermittent CP but do not last long and not associated with exertion. She sees cardiology every 6 months. Denies any lower extremity edema. She continues to smoke but wearing nicoderm patch today and trying to cut back.   Her mood is better since quitting work. She and her husband are having some relationship problems from working together and she is hoping this will help. She is using buspar and xanax as needed.   .. Active Ambulatory Problems    Diagnosis Date Noted   ANKLE PAIN 02/02/2011   GERD (gastroesophageal reflux disease) 11/08/2013   Epigastric pain 11/08/2013   Uncontrolled diabetes mellitus (HCC) 11/08/2013   Hepatic steatosis 11/27/2014   Adenoma of left adrenal gland 11/27/2014   Constipation 11/27/2014   Type 2 diabetes mellitus with hyperglycemia, with long-term current use of insulin (HCC) 01/27/2016   Onychomycosis 01/27/2016   Costovertebral angle tenderness 03/30/2016   GAD (generalized anxiety disorder) 12/25/2016   Insomnia 12/25/2016   Polyarthralgia 12/25/2016   Hypertriglyceridemia 01/01/2017   Right hip pain 07/11/2017   Hyperlipidemia associated with type 2 diabetes mellitus (HCC) 07/13/2017   Primary osteoarthritis of right hip 07/13/2017   Trochanteric bursitis of right hip 01/06/2018   Right  knee pain 01/06/2018   No energy 01/30/2018   Chronic pain of both knees 01/30/2018   Tobacco abuse 12/05/2018   Tobacco dependence 12/25/2019   Right upper quadrant abdominal pain 01/30/2020   Depression, major, single episode, mild (HCC) 02/06/2020   Degenerative arthritis of thumb, left 11/05/2020   Degenerative arthritis of thumb, right 11/05/2020   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HCC) 11/05/2020   Non compliance w medication regimen 11/05/2020   Bilateral thumb pain 11/05/2020   Anxiety 11/05/2020   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-sided sciatica 11/05/2020   Coronary artery disease involving coronary bypass graft of native heart without angina pectoris 02/19/2021   Neuropathy due to secondary diabetes (HCC) 02/19/2021   Gross hematuria 02/19/2021   Diabetes mellitus with hemoglobin A1c goal of 7.0%-8.0% (HCC) 02/19/2021   Depressed mood 02/19/2021   NSTEMI (non-ST elevated myocardial infarction) (HCC) 02/19/2021   Class 1 obesity due to excess calories with serious comorbidity and body mass index (BMI) of 33.0 to 33.9 in adult 02/19/2021   PTSD (post-traumatic stress disorder) 02/19/2021   Stress 10/03/2021   Chronic left shoulder pain 04/01/2022   CAD, multiple vessel 09/16/2022   Primary hypertension 09/16/2022   COPD (chronic obstructive pulmonary disease) (HCC) 01/05/2023   Adjustment disorder with mixed anxiety and depressed mood 04/09/2023   Microalbuminuria 08/17/2023   Current smoker 08/18/2023   Uncontrolled hypertension 08/18/2023   Resolved Ambulatory Problems    Diagnosis Date Noted   Black tarry stools 03/30/2016  Past Medical History:  Diagnosis Date   Diabetes mellitus without complication (HCC)    Mini stroke      ROS See HPI.    Objective:     BP (!) 155/82   Pulse 82   Ht 5\' 7"  (1.702 m)   Wt 210 lb (95.3 kg)   SpO2 99%   BMI 32.89 kg/m  BP Readings from Last 3 Encounters:  08/17/23 (!) 155/82  06/01/23 (!) 136/94   04/09/23 (!) 169/89   Wt Readings from Last 3 Encounters:  08/17/23 210 lb (95.3 kg)  06/01/23 205 lb 1.3 oz (93 kg)  04/09/23 209 lb (94.8 kg)    .[name] Kitchen Results for orders placed or performed in visit on 08/17/23  POCT HgB A1C  Result Value Ref Range   Hemoglobin A1C 9.1 (A) 4.0 - 5.6 %   HbA1c POC (<> result, manual entry)     HbA1c, POC (prediabetic range)     HbA1c, POC (controlled diabetic range)    POCT UA - Microalbumin  Result Value Ref Range   Microalbumin Ur, POC 10 mg/L   Creatinine, POC 50 mg/dL   Albumin/Creatinine Ratio, Urine, POC 30-300       Physical Exam Constitutional:      Appearance: Normal appearance. [name].  HENT:     Head: Normocephalic.  Cardiovascular:     Rate and Rhythm: Normal rate and regular rhythm.  Pulmonary:     Effort: Pulmonary effort is normal.     Breath sounds: Normal breath sounds.  Musculoskeletal:     Right lower leg: No edema.     Left lower leg: No edema.  Neurological:     General: No focal deficit present.     Mental Status: She is alert and oriented to person, place, and time.  Psychiatric:        Mood and Affect: Mood normal.        Assessment & Plan:  [name] KitchenMarland KitchenCherrelle was seen today for medical management of chronic issues.  Diagnoses and all orders for this visit:  Uncontrolled type 2 diabetes mellitus with hyperglycemia (HCC) -     POCT HgB A1C -     POCT UA - Microalbumin -     Lipid panel -     CMP14+EGFR -     TSH -     CBC w/Diff/Platelet -     losartan (COZAAR) 25 MG tablet; Take 1 tablet (25 mg total) by mouth daily. -     metFORMIN (GLUCOPHAGE XR) 500 MG 24 hr tablet; Take 2 tablets (1,000 mg total) by mouth daily with breakfast. -     Dulaglutide (TRULICITY) 3 MG/0.5ML SOAJ; Inject 3 mg into the skin once a week. -     Insulin Glargine w/ Trans Port (BASAGLAR TEMPO PEN) 100 UNIT/ML SOPN; Start 10 units at bedtime increase by 2 units every 5 days until fasting glucose under 130.  CAD, multiple  vessel -     amLODipine (NORVASC) 10 MG tablet; Take 1 tablet (10 mg total) by mouth daily. -     Lipid panel  Class 1 obesity due to excess calories with serious comorbidity and body mass index (BMI) of 33.0 to 33.9 in adult -     Lipid panel -     CMP14+EGFR -     TSH -     CBC w/Diff/Platelet  Coronary artery disease involving coronary bypass graft of native heart without angina pectoris -     metoprolol  tartrate (LOPRESSOR) 25 MG tablet; Take 1 tablet (25 mg total) by mouth in the morning and at bedtime. -     atorvastatin (LIPITOR) 40 MG tablet; Take 1 tablet (40 mg total) by mouth daily. -     Lipid panel -     CMP14+EGFR -     TSH -     CBC w/Diff/Platelet  Primary hypertension -     amLODipine (NORVASC) 10 MG tablet; Take 1 tablet (10 mg total) by mouth daily. -     losartan (COZAAR) 25 MG tablet; Take 1 tablet (25 mg total) by mouth daily.  Neuropathy due to secondary diabetes (HCC) -     metFORMIN (GLUCOPHAGE XR) 500 MG 24 hr tablet; Take 2 tablets (1,000 mg total) by mouth daily with breakfast.  Microalbuminuria -     losartan (COZAAR) 25 MG tablet; Take 1 tablet (25 mg total) by mouth daily.  Current smoker  Anxiety -     ALPRAZolam (XANAX) 0.5 MG tablet; Take 1 tablet (0.5 mg total) by mouth 2 (two) times daily as needed for anxiety. -     busPIRone (BUSPAR) 7.5 MG tablet; Take 1 tablet (7.5 mg total) by mouth 3 (three) times daily.  Adjustment disorder with mixed anxiety and depressed mood -     ALPRAZolam (XANAX) 0.5 MG tablet; Take 1 tablet (0.5 mg total) by mouth 2 (two) times daily as needed for anxiety. -     busPIRone (BUSPAR) 7.5 MG tablet; Take 1 tablet (7.5 mg total) by mouth 3 (three) times daily.  Uncontrolled hypertension   Pt is losing insurance in next week.  Discussed DM goals and put them in AVS Strongly encouraged her to stay on medication A1C is not controlled today Trulicity increased to 3mg  weekly Added basaglar at 10 units a  bedtime Start checking sugars in morning and goal fasting should be under 130, increase by 2 units every 5 days until to goal Stay on metroprolol and statin BP not control and some protein detected in microalbumin testing Start cozaar 25mg  daily Recheck BP nurse visit in 2 weeks Pt declined eye exam due to cost right now Continue to work on smoking cessation with wearing patches and cutting back weekly  Refilled buspar to take regularly and xanax as needed  Strongly encouraged patient to get labs while she has insurance.     Return in about 3 months (around 11/17/2023), or if symptoms worsen or fail to improve, for 2 weeks nurse BP check.    Tandy Gaw, PA-C

## 2023-08-18 ENCOUNTER — Encounter: Payer: Self-pay | Admitting: Physician Assistant

## 2023-08-18 DIAGNOSIS — I10 Essential (primary) hypertension: Secondary | ICD-10-CM | POA: Insufficient documentation

## 2023-08-18 DIAGNOSIS — F17200 Nicotine dependence, unspecified, uncomplicated: Secondary | ICD-10-CM | POA: Insufficient documentation

## 2023-08-18 MED ORDER — BUSPIRONE HCL 7.5 MG PO TABS
7.5000 mg | ORAL_TABLET | Freq: Three times a day (TID) | ORAL | 1 refills | Status: AC
Start: 2023-08-18 — End: ?

## 2023-08-18 MED ORDER — ALPRAZOLAM 0.5 MG PO TABS: 0.5000 mg | ORAL_TABLET | Freq: Two times a day (BID) | ORAL | 0 refills | Status: DC | PRN

## 2023-08-19 ENCOUNTER — Encounter: Payer: Self-pay | Admitting: Physician Assistant

## 2023-08-19 ENCOUNTER — Telehealth: Payer: Self-pay

## 2023-08-19 NOTE — Telephone Encounter (Signed)
Patient states that her Trulicity is $125 for one pen, and patient would like to know if there is an alternative that she could get or change dosage, please advise, thanks.

## 2023-08-20 LAB — LIPID PANEL
Chol/HDL Ratio: 7.3 ratio — ABNORMAL HIGH (ref 0.0–4.4)
Cholesterol, Total: 241 mg/dL — ABNORMAL HIGH (ref 100–199)
HDL: 33 mg/dL — ABNORMAL LOW (ref >39)
LDL Chol Calc (NIH): 176 mg/dL — ABNORMAL HIGH (ref 0–99)
Triglycerides: 170 mg/dL — ABNORMAL HIGH (ref 0–149)
VLDL Cholesterol Cal: 32 mg/dL (ref 5–40)

## 2023-08-20 LAB — CBC WITH DIFFERENTIAL/PLATELET
Basophils Absolute: 0.1 10*3/uL (ref 0.0–0.2)
Basos: 1 %
EOS (ABSOLUTE): 0 10*3/uL (ref 0.0–0.4)
Eos: 0 %
Hematocrit: 44.1 % (ref 34.0–46.6)
Hemoglobin: 14.7 g/dL (ref 11.1–15.9)
Immature Grans (Abs): 0 10*3/uL (ref 0.0–0.1)
Immature Granulocytes: 0 %
Lymphocytes Absolute: 2 10*3/uL (ref 0.7–3.1)
Lymphs: 29 %
MCH: 28.7 pg (ref 26.6–33.0)
MCHC: 33.3 g/dL (ref 31.5–35.7)
MCV: 86 fL (ref 79–97)
Monocytes Absolute: 0.4 10*3/uL (ref 0.1–0.9)
Monocytes: 6 %
Neutrophils Absolute: 4.4 10*3/uL (ref 1.4–7.0)
Neutrophils: 64 %
Platelets: 187 10*3/uL (ref 150–450)
RBC: 5.12 x10E6/uL (ref 3.77–5.28)
RDW: 13.5 % (ref 11.7–15.4)
WBC: 6.9 10*3/uL (ref 3.4–10.8)

## 2023-08-20 LAB — CMP14+EGFR
ALT: 16 [IU]/L (ref 0–32)
AST: 12 [IU]/L (ref 0–40)
Albumin: 4.4 g/dL (ref 3.8–4.9)
Alkaline Phosphatase: 118 [IU]/L (ref 44–121)
BUN/Creatinine Ratio: 22 (ref 9–23)
BUN: 16 mg/dL (ref 6–24)
Bilirubin Total: 0.5 mg/dL (ref 0.0–1.2)
CO2: 19 mmol/L — ABNORMAL LOW (ref 20–29)
Calcium: 9.1 mg/dL (ref 8.7–10.2)
Chloride: 103 mmol/L (ref 96–106)
Creatinine, Ser: 0.72 mg/dL (ref 0.57–1.00)
Globulin, Total: 2.4 g/dL (ref 1.5–4.5)
Glucose: 227 mg/dL — ABNORMAL HIGH (ref 70–99)
Potassium: 4.3 mmol/L (ref 3.5–5.2)
Sodium: 137 mmol/L (ref 134–144)
Total Protein: 6.8 g/dL (ref 6.0–8.5)
eGFR: 97 mL/min/{1.73_m2} (ref >59)

## 2023-08-20 LAB — TSH: TSH: 2.52 u[IU]/mL (ref 0.450–4.500)

## 2023-08-20 MED ORDER — TRULICITY 1.5 MG/0.5ML ~~LOC~~ SOAJ
1.5000 mg | SUBCUTANEOUS | 2 refills | Status: DC
Start: 2023-08-20 — End: 2023-11-17

## 2023-08-20 NOTE — Progress Notes (Signed)
[place],   Cholesterol is not to goal.  You have to start taking lipitor.   Liver and kidney look good.   Thyroid looks good.

## 2023-08-20 NOTE — Telephone Encounter (Signed)
If you are going to be without insurance there is no option for a medication in that class. We may have to increase your insulin some until we can figure out your coverage.

## 2023-08-24 NOTE — Telephone Encounter (Signed)
Ok great.

## 2023-08-24 NOTE — Telephone Encounter (Signed)
Spoke to patient, patient states that Karin Golden will hold her Trulicity for (8 days 4 pens with lower dosage for $240), Karin Golden states that there's a shortage on Trulicity, thanks.

## 2023-08-25 ENCOUNTER — Other Ambulatory Visit: Payer: Self-pay | Admitting: Physician Assistant

## 2023-08-25 ENCOUNTER — Ambulatory Visit (INDEPENDENT_AMBULATORY_CARE_PROVIDER_SITE_OTHER): Payer: No Typology Code available for payment source | Admitting: Physician Assistant

## 2023-08-25 ENCOUNTER — Encounter: Payer: Self-pay | Admitting: Physician Assistant

## 2023-08-25 VITALS — BP 158/79 | HR 89 | Ht 67.0 in | Wt 210.0 lb

## 2023-08-25 DIAGNOSIS — I252 Old myocardial infarction: Secondary | ICD-10-CM

## 2023-08-25 DIAGNOSIS — I2510 Atherosclerotic heart disease of native coronary artery without angina pectoris: Secondary | ICD-10-CM

## 2023-08-25 DIAGNOSIS — M25561 Pain in right knee: Secondary | ICD-10-CM

## 2023-08-25 DIAGNOSIS — E66811 Obesity, class 1: Secondary | ICD-10-CM | POA: Diagnosis not present

## 2023-08-25 DIAGNOSIS — G8929 Other chronic pain: Secondary | ICD-10-CM

## 2023-08-25 DIAGNOSIS — E1165 Type 2 diabetes mellitus with hyperglycemia: Secondary | ICD-10-CM | POA: Diagnosis not present

## 2023-08-25 DIAGNOSIS — I25810 Atherosclerosis of coronary artery bypass graft(s) without angina pectoris: Secondary | ICD-10-CM

## 2023-08-25 DIAGNOSIS — I10 Essential (primary) hypertension: Secondary | ICD-10-CM | POA: Diagnosis not present

## 2023-08-25 DIAGNOSIS — F4323 Adjustment disorder with mixed anxiety and depressed mood: Secondary | ICD-10-CM

## 2023-08-25 DIAGNOSIS — E6609 Other obesity due to excess calories: Secondary | ICD-10-CM

## 2023-08-25 DIAGNOSIS — M25562 Pain in left knee: Secondary | ICD-10-CM

## 2023-08-25 DIAGNOSIS — Z7984 Long term (current) use of oral hypoglycemic drugs: Secondary | ICD-10-CM

## 2023-08-25 DIAGNOSIS — Z1231 Encounter for screening mammogram for malignant neoplasm of breast: Secondary | ICD-10-CM

## 2023-08-25 MED ORDER — DULOXETINE HCL 30 MG PO CPEP: 30.0000 mg | ORAL_CAPSULE | Freq: Every day | ORAL | 1 refills | Status: DC

## 2023-08-25 NOTE — Progress Notes (Signed)
Established Patient Office Visit  Subjective   Patient ID: Denise Tyler, female    DOB: Nov 15, 1965  Age: 57 y.o. MRN: [id number]  Chief Complaint  Patient presents with   Medical Management of Chronic Issues    HPI Pt is a 57 yo obese female with T2DM, HTN, HLD, hx of MI who presents to the clinic to discuss disability.   She quit her job due to stress and not being able to work with her husband. She aches all over. She has many uncontrolled needs and wonders if she should file for disability.  .. Active Ambulatory Problems    Diagnosis Date Noted   ANKLE PAIN 02/02/2011   GERD (gastroesophageal reflux disease) 11/08/2013   Epigastric pain 11/08/2013   Uncontrolled diabetes mellitus (HCC) 11/08/2013   Hepatic steatosis 11/27/2014   Adenoma of left adrenal gland 11/27/2014   Constipation 11/27/2014   Type 2 diabetes mellitus with hyperglycemia, with long-term current use of insulin (HCC) 01/27/2016   Onychomycosis 01/27/2016   Costovertebral angle tenderness 03/30/2016   GAD (generalized anxiety disorder) 12/25/2016   Insomnia 12/25/2016   Polyarthralgia 12/25/2016   Hypertriglyceridemia 01/01/2017   Right hip pain 07/11/2017   Hyperlipidemia associated with type 2 diabetes mellitus (HCC) 07/13/2017   Primary osteoarthritis of right hip 07/13/2017   Trochanteric bursitis of right hip 01/06/2018   Right knee pain 01/06/2018   No energy 01/30/2018   Chronic pain of both knees 01/30/2018   Tobacco abuse 12/05/2018   Tobacco dependence 12/25/2019   Right upper quadrant abdominal pain 01/30/2020   Depression, major, single episode, mild (HCC) 02/06/2020   Degenerative arthritis of thumb, left 11/05/2020   Degenerative arthritis of thumb, right 11/05/2020   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HCC) 11/05/2020   Non compliance w medication regimen 11/05/2020   Bilateral thumb pain 11/05/2020   Anxiety 11/05/2020   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-sided sciatica  11/05/2020   Coronary artery disease involving coronary bypass graft of native heart without angina pectoris 02/19/2021   Neuropathy due to secondary diabetes (HCC) 02/19/2021   Gross hematuria 02/19/2021   Diabetes mellitus with hemoglobin A1c goal of 7.0%-8.0% (HCC) 02/19/2021   Depressed mood 02/19/2021   NSTEMI (non-ST elevated myocardial infarction) (HCC) 02/19/2021   Class 1 obesity due to excess calories with serious comorbidity and body mass index (BMI) of 33.0 to 33.9 in adult 02/19/2021   PTSD (post-traumatic stress disorder) 02/19/2021   Stress 10/03/2021   Chronic left shoulder pain 04/01/2022   CAD, multiple vessel 09/16/2022   Primary hypertension 09/16/2022   COPD (chronic obstructive pulmonary disease) (HCC) 01/05/2023   Adjustment disorder with mixed anxiety and depressed mood 04/09/2023   Microalbuminuria 08/17/2023   Current smoker 08/18/2023   Uncontrolled hypertension 08/18/2023   History of MI (myocardial infarction) 08/27/2023   Resolved Ambulatory Problems    Diagnosis Date Noted   Black tarry stools 03/30/2016   Past Medical History:  Diagnosis Date   Diabetes mellitus without complication (HCC)    Mini stroke     ROS See HPI>    Objective:     BP (!) 158/79   Pulse 89   Ht 5\' 7"  (1.702 m)   Wt 210 lb (95.3 kg)   SpO2 97%   BMI 32.89 kg/m  BP Readings from Last 3 Encounters:  08/25/23 (!) 158/79  08/17/23 (!) 155/82  06/01/23 (!) 136/94   Wt Readings from Last 3 Encounters:  08/25/23 210 lb (95.3 kg)  08/17/23 210 lb (  95.3 kg)  06/01/23 205 lb 1.3 oz (93 kg)    ..    06/01/2023   10:26 AM 04/09/2023    1:35 PM 11/30/2022    3:54 PM 10/12/2022   10:11 AM 09/16/2022    8:30 AM  Depression screen PHQ 2/9  Decreased Interest 0 0 0 0 0  Down, Depressed, Hopeless 0 0 0 0 0  PHQ - 2 Score 0 0 0 0 0  Altered sleeping  1     Tired, decreased energy  1     Change in appetite  2     Feeling bad or failure about yourself   0     Trouble  concentrating  0     Moving slowly or fidgety/restless  0     Suicidal thoughts  0     PHQ-9 Score  4     Difficult doing work/chores  Somewhat difficult      .[name] Kitchen    04/09/2023    1:35 PM 10/03/2021    3:05 PM 01/30/2020    4:37 PM 12/25/2019    8:12 AM  GAD 7 : Generalized Anxiety Score  Nervous, Anxious, on Edge 2 2 2 3   Control/stop worrying 2 2 2 3   Worry too much - different things 2 2 2  0  Trouble relaxing 2 2 2 3   Restless 2 1 1  0  Easily annoyed or irritable 2 1 2  0  Afraid - awful might happen 2 2 2 2   Total GAD 7 Score 14 12 13 11   Anxiety Difficulty Somewhat difficult Somewhat difficult Somewhat difficult Somewhat difficult      Physical Exam Constitutional:      Appearance: Normal appearance. [name].  Cardiovascular:     Rate and Rhythm: Normal rate.  Pulmonary:     Effort: Pulmonary effort is normal.  Musculoskeletal:     Right lower leg: No edema.     Left lower leg: No edema.  Neurological:     General: No focal deficit present.     Mental Status: She is alert and oriented to person, place, and time.  Psychiatric:        Mood and Affect: Mood normal.          Assessment & Plan:  [name] KitchenMarland KitchenDorette was seen today for medical management of chronic issues.  Diagnoses and all orders for this visit:  Uncontrolled type 2 diabetes mellitus with hyperglycemia (HCC)  CAD, multiple vessel  Class 1 obesity due to excess calories with serious comorbidity and body mass index (BMI) of 33.0 to 33.9 in adult  Coronary artery disease involving coronary bypass graft of native heart without angina pectoris  Primary hypertension  History of MI (myocardial infarction)  Chronic pain of both knees -     DULoxetine (CYMBALTA) 30 MG capsule; Take 1 capsule (30 mg total) by mouth daily.  Adjustment disorder with mixed anxiety and depressed mood -     DULoxetine (CYMBALTA) 30 MG capsule; Take 1 capsule (30 mg total) by mouth daily.   Discussed with patient I do not  think she is candidate for disability right now. I think she would flourish in the right working environment and getting BP and sugars controlled.  Start cymbalta for mood and pain control.  I really think she would benefit from a counselor.  Increase cozaar to 50mg  due to BP not being controlled.  Start monitoring BP at home.  Pt is losing insurance so recheck in 3 months.  Spent 45 minutes reviewing chart and discussing how to get labs controlled and back on track.      Tandy Gaw, PA-C

## 2023-08-27 ENCOUNTER — Encounter: Payer: Self-pay | Admitting: Physician Assistant

## 2023-08-27 DIAGNOSIS — I252 Old myocardial infarction: Secondary | ICD-10-CM | POA: Insufficient documentation

## 2023-08-27 NOTE — Patient Instructions (Signed)
Increase cozaar to 50mg   Start cymbalta 30mg 

## 2023-10-29 ENCOUNTER — Ambulatory Visit (INDEPENDENT_AMBULATORY_CARE_PROVIDER_SITE_OTHER): Payer: No Typology Code available for payment source | Admitting: Physician Assistant

## 2023-10-29 ENCOUNTER — Encounter: Payer: Self-pay | Admitting: Physician Assistant

## 2023-10-29 VITALS — BP 128/78 | HR 78 | Temp 98.1°F

## 2023-10-29 DIAGNOSIS — J441 Chronic obstructive pulmonary disease with (acute) exacerbation: Secondary | ICD-10-CM | POA: Diagnosis not present

## 2023-10-29 MED ORDER — IPRATROPIUM-ALBUTEROL 0.5-2.5 (3) MG/3ML IN SOLN: 3.0000 mL | Freq: Once | RESPIRATORY_TRACT | Status: DC

## 2023-10-29 MED ORDER — AZITHROMYCIN 250 MG PO TABS: ORAL_TABLET | ORAL | 0 refills | Status: DC

## 2023-10-29 MED ORDER — PREDNISONE 20 MG PO TABS: ORAL_TABLET | ORAL | 0 refills | Status: DC

## 2023-10-29 NOTE — Patient Instructions (Signed)

## 2023-10-29 NOTE — Progress Notes (Signed)
 Acute Office Visit  Subjective:     Patient ID: Denise Tyler, female    DOB: 1965/11/04, 58 y.o.   MRN: [id number]  Chief Complaint  Patient presents with   Cough    Flu like symsptoms    HPI Patient is in today for productive cough, shortness of breath, body aches, sinus congestion for last 2-3 days. She is a smoker with COPd. Her husband tested negative for covid and flu and has similar symptoms. She does not have insurance and wants to not do testing. She is not vaccinated against flu or covid. She does not have rescue inhaler. Cough is worsening.   .. Active Ambulatory Problems    Diagnosis Date Noted   ANKLE PAIN 02/02/2011   GERD (gastroesophageal reflux disease) 11/08/2013   Epigastric pain 11/08/2013   Uncontrolled diabetes mellitus (HCC) 11/08/2013   Hepatic steatosis 11/27/2014   Adenoma of left adrenal gland 11/27/2014   Constipation 11/27/2014   Type 2 diabetes mellitus with hyperglycemia, with long-term current use of insulin  (HCC) 01/27/2016   Onychomycosis 01/27/2016   Costovertebral angle tenderness 03/30/2016   GAD (generalized anxiety disorder) 12/25/2016   Insomnia 12/25/2016   Polyarthralgia 12/25/2016   Hypertriglyceridemia 01/01/2017   Right hip pain 07/11/2017   Hyperlipidemia associated with type 2 diabetes mellitus (HCC) 07/13/2017   Primary osteoarthritis of right hip 07/13/2017   Trochanteric bursitis of right hip 01/06/2018   Right knee pain 01/06/2018   No energy 01/30/2018   Chronic pain of both knees 01/30/2018   Tobacco abuse 12/05/2018   Tobacco dependence 12/25/2019   Right upper quadrant abdominal pain 01/30/2020   Depression, major, single episode, mild (HCC) 02/06/2020   Degenerative arthritis of thumb, left 11/05/2020   Degenerative arthritis of thumb, right 11/05/2020   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HCC) 11/05/2020   Non compliance w medication regimen 11/05/2020   Bilateral thumb pain 11/05/2020   Anxiety 11/05/2020    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-sided sciatica 11/05/2020   Coronary artery disease involving coronary bypass graft of native heart without angina pectoris 02/19/2021   Neuropathy due to secondary diabetes (HCC) 02/19/2021   Gross hematuria 02/19/2021   Diabetes mellitus with hemoglobin A1c goal of 7.0%-8.0% (HCC) 02/19/2021   Depressed mood 02/19/2021   NSTEMI (non-ST elevated myocardial infarction) (HCC) 02/19/2021   Class 1 obesity due to excess calories with serious comorbidity and body mass index (BMI) of 33.0 to 33.9 in adult 02/19/2021   PTSD (post-traumatic stress disorder) 02/19/2021   Stress 10/03/2021   Chronic left shoulder pain 04/01/2022   CAD, multiple vessel 09/16/2022   Primary hypertension 09/16/2022   COPD (chronic obstructive pulmonary disease) (HCC) 01/05/2023   Adjustment disorder with mixed anxiety and depressed mood 04/09/2023   Microalbuminuria 08/17/2023   Current smoker 08/18/2023   Uncontrolled hypertension 08/18/2023   History of MI (myocardial infarction) 08/27/2023   Resolved Ambulatory Problems    Diagnosis Date Noted   Black tarry stools 03/30/2016   Past Medical History:  Diagnosis Date   Diabetes mellitus without complication (HCC)    Mini stroke      Review of Systems  All other systems reviewed and are negative.       Objective:    BP 128/78   Pulse 78   Temp 98.1 F (36.7 C) (Oral)   SpO2 98%  BP Readings from Last 3 Encounters:  10/29/23 128/78  08/25/23 (!) 158/79  08/17/23 (!) 155/82   Wt Readings from Last 3 Encounters:  08/25/23 210 lb (95.3 kg)  08/17/23 210 lb (95.3 kg)  06/01/23 205 lb 1.3 oz (93 kg)      Physical Exam Constitutional:      Appearance: Normal appearance.  HENT:     Head: Normocephalic.     Right Ear: External ear normal. There is no impacted cerumen.     Left Ear: Ear canal and external ear normal. There is no impacted cerumen.     Ears:     Comments: Dull bilateral TM's     Nose: Congestion present.     Mouth/Throat:     Mouth: Mucous membranes are moist.     Pharynx: Posterior oropharyngeal erythema present.  Eyes:     Conjunctiva/sclera: Conjunctivae normal.  Neck:     Vascular: No carotid bruit.  Cardiovascular:     Rate and Rhythm: Normal rate and regular rhythm.  Pulmonary:     Comments: Coughing with deep breathing Rales/rhonchi/wheezing bilateral lung throughout After neb Rales and rhonchi resolved and lungs left with wheezing bilateral lungs Musculoskeletal:     Cervical back: Normal range of motion and neck supple. No rigidity or tenderness.  Lymphadenopathy:     Cervical: Cervical adenopathy present.  Neurological:     General: No focal deficit present.     Mental Status: She is alert and oriented to person, place, and time.     Duoneb given in office      Assessment & Plan:  SABRASABRAAmarionna was seen today for cough.  Diagnoses and all orders for this visit:  COPD exacerbation (HCC) -     ipratropium-albuterol  (DUONEB) 0.5-2.5 (3) MG/3ML nebulizer solution 3 mL -     azithromycin  (ZITHROMAX  Z-PAK) 250 MG tablet; Take 2 tablets (500 mg) on  Day 1,  followed by 1 tablet (250 mg) once daily on Days 2 through 5. -     predniSONE  (DELTASONE ) 20 MG tablet; Take one tablet twice a day for 5 days.  Vitals reassuring Pulse ox 97 percent Will use husbands albuterol  inhaler every 2-4 hours  Zpak and prednisone  given in office today Follow up as needed or if symptoms worsen or persist  Vermell Bologna, PA-C

## 2023-11-17 ENCOUNTER — Ambulatory Visit: Payer: Self-pay | Admitting: Physician Assistant

## 2023-11-17 VITALS — BP 114/74 | HR 98 | Ht 67.0 in | Wt 210.0 lb

## 2023-11-17 DIAGNOSIS — I10 Essential (primary) hypertension: Secondary | ICD-10-CM

## 2023-11-17 DIAGNOSIS — I25810 Atherosclerosis of coronary artery bypass graft(s) without angina pectoris: Secondary | ICD-10-CM

## 2023-11-17 DIAGNOSIS — F4323 Adjustment disorder with mixed anxiety and depressed mood: Secondary | ICD-10-CM

## 2023-11-17 DIAGNOSIS — Z7984 Long term (current) use of oral hypoglycemic drugs: Secondary | ICD-10-CM

## 2023-11-17 DIAGNOSIS — I252 Old myocardial infarction: Secondary | ICD-10-CM

## 2023-11-17 DIAGNOSIS — F419 Anxiety disorder, unspecified: Secondary | ICD-10-CM

## 2023-11-17 DIAGNOSIS — J411 Mucopurulent chronic bronchitis: Secondary | ICD-10-CM

## 2023-11-17 DIAGNOSIS — R809 Proteinuria, unspecified: Secondary | ICD-10-CM

## 2023-11-17 DIAGNOSIS — E1165 Type 2 diabetes mellitus with hyperglycemia: Secondary | ICD-10-CM

## 2023-11-17 DIAGNOSIS — F17210 Nicotine dependence, cigarettes, uncomplicated: Secondary | ICD-10-CM

## 2023-11-17 LAB — POCT GLYCOSYLATED HEMOGLOBIN (HGB A1C): Hemoglobin A1C: 8.6 % — AB (ref 4.0–5.6)

## 2023-11-17 MED ORDER — ALPRAZOLAM 0.5 MG PO TABS: 0.5000 mg | ORAL_TABLET | Freq: Two times a day (BID) | ORAL | 0 refills | Status: AC | PRN

## 2023-11-17 MED ORDER — GLIPIZIDE 10 MG PO TABS: 10.0000 mg | ORAL_TABLET | Freq: Two times a day (BID) | ORAL | 3 refills | Status: DC

## 2023-11-17 MED ORDER — BUDESONIDE-FORMOTEROL FUMARATE 160-4.5 MCG/ACT IN AERO: 2.0000 | INHALATION_SPRAY | Freq: Two times a day (BID) | RESPIRATORY_TRACT | 3 refills | Status: AC

## 2023-11-17 MED ORDER — BASAGLAR TEMPO PEN 100 UNIT/ML ~~LOC~~ SOPN: PEN_INJECTOR | SUBCUTANEOUS | 0 refills | Status: DC

## 2023-11-17 MED ORDER — GLUCOSE BLOOD VI STRP: ORAL_STRIP | 12 refills | Status: DC

## 2023-11-17 MED ORDER — LOSARTAN POTASSIUM 25 MG PO TABS: ORAL_TABLET | ORAL | 1 refills | Status: DC

## 2023-11-17 NOTE — Progress Notes (Signed)
Established Patient Office Visit  Subjective   Patient ID: Denise Tyler, female    DOB: 05/23/1966  Age: 58 y.o. MRN: [id number]  Chief Complaint  Patient presents with   Medical Management of Chronic Issues    3 mo fup d/m    HPI Pt is a 58 yo obese female with T2DM, HTN, CAD, hx of MI, MDD, GAD, GERD who presents to the clinic for follow up.   She has been getting high sugar readings at home some in the 500s. She admits she has not been taking trulicity in 2 months. No open sores or wounds. No hypoglycemic events. She has intermittent CP that come and go very fast. She has a chronic productive cough. She continues to smoke.    .. Active Ambulatory Problems    Diagnosis Date Noted   ANKLE PAIN 02/02/2011   GERD (gastroesophageal reflux disease) 11/08/2013   Epigastric pain 11/08/2013   Uncontrolled diabetes mellitus (HCC) 11/08/2013   Hepatic steatosis 11/27/2014   Adenoma of left adrenal gland 11/27/2014   Constipation 11/27/2014   Type 2 diabetes mellitus with hyperglycemia, with long-term current use of insulin (HCC) 01/27/2016   Onychomycosis 01/27/2016   Costovertebral angle tenderness 03/30/2016   GAD (generalized anxiety disorder) 12/25/2016   Insomnia 12/25/2016   Polyarthralgia 12/25/2016   Hypertriglyceridemia 01/01/2017   Right hip pain 07/11/2017   Hyperlipidemia associated with type 2 diabetes mellitus (HCC) 07/13/2017   Primary osteoarthritis of right hip 07/13/2017   Trochanteric bursitis of right hip 01/06/2018   Right knee pain 01/06/2018   No energy 01/30/2018   Chronic pain of both knees 01/30/2018   Tobacco abuse 12/05/2018   Tobacco dependence 12/25/2019   Right upper quadrant abdominal pain 01/30/2020   Depression, major, single episode, mild (HCC) 02/06/2020   Degenerative arthritis of thumb, left 11/05/2020   Degenerative arthritis of thumb, right 11/05/2020   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HCC) 11/05/2020   Non compliance w medication  regimen 11/05/2020   Bilateral thumb pain 11/05/2020   Anxiety 11/05/2020   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-sided sciatica 11/05/2020   Coronary artery disease involving coronary bypass graft of native heart without angina pectoris 02/19/2021   Neuropathy due to secondary diabetes (HCC) 02/19/2021   Gross hematuria 02/19/2021   Diabetes mellitus with hemoglobin A1c goal of 7.0%-8.0% (HCC) 02/19/2021   Depressed mood 02/19/2021   NSTEMI (non-ST elevated myocardial infarction) (HCC) 02/19/2021   Class 1 obesity due to excess calories with serious comorbidity and body mass index (BMI) of 33.0 to 33.9 in adult 02/19/2021   PTSD (post-traumatic stress disorder) 02/19/2021   Stress 10/03/2021   Chronic left shoulder pain 04/01/2022   CAD, multiple vessel 09/16/2022   Primary hypertension 09/16/2022   COPD (chronic obstructive pulmonary disease) (HCC) 01/05/2023   Adjustment disorder with mixed anxiety and depressed mood 04/09/2023   Microalbuminuria 08/17/2023   Current smoker 08/18/2023   Uncontrolled hypertension 08/18/2023   History of MI (myocardial infarction) 08/27/2023   Cigarette nicotine dependence without complication 11/17/2023   Resolved Ambulatory Problems    Diagnosis Date Noted   Black tarry stools 03/30/2016   Past Medical History:  Diagnosis Date   Diabetes mellitus without complication (HCC)    Mini stroke      ROS See HPI.    Objective:     BP 114/74   Pulse 98   Ht 5\' 7"  (1.702 m)   Wt 210 lb (95.3 kg)   SpO2 94%   BMI  32.89 kg/m  BP Readings from Last 3 Encounters:  11/17/23 114/74  10/29/23 128/78  08/25/23 (!) 158/79   Wt Readings from Last 3 Encounters:  11/17/23 210 lb (95.3 kg)  08/25/23 210 lb (95.3 kg)  08/17/23 210 lb (95.3 kg)      Physical Exam Constitutional:      Appearance: Normal appearance. [name].  HENT:     Head: Normocephalic.  Cardiovascular:     Rate and Rhythm: Normal rate and regular rhythm.   Pulmonary:     Effort: Pulmonary effort is normal.     Comments: Productive cough on exam Rhonchi cleared with cough Musculoskeletal:     Right lower leg: No edema.     Left lower leg: No edema.  Neurological:     General: No focal deficit present.     Mental Status: She is alert and oriented to person, place, and time.  Psychiatric:        Mood and Affect: Mood normal.      Results for orders placed or performed in visit on 11/17/23  POCT HgB A1C  Result Value Ref Range   Hemoglobin A1C 8.6 (A) 4.0 - 5.6 %   HbA1c POC (<> result, manual entry)     HbA1c, POC (prediabetic range)     HbA1c, POC (controlled diabetic range)        Assessment & Plan:  [name] KitchenMarland KitchenGisela was seen today for medical management of chronic issues.  Diagnoses and all orders for this visit:  Uncontrolled type 2 diabetes mellitus with hyperglycemia (HCC) -     POCT HgB A1C -     losartan (COZAAR) 25 MG tablet; Take one tablet daily and can take another tablet if BP above 140/90. -     Insulin Glargine w/ Trans Port (BASAGLAR TEMPO PEN) 100 UNIT/ML SOPN; Start 15 units at bedtime increase by 2 units every 5 days until fasting glucose under 130. -     glipiZIDE (GLUCOTROL) 10 MG tablet; Take 1 tablet (10 mg total) by mouth 2 (two) times daily before a meal. -     glucose blood test strip; Use as instructed  Primary hypertension -     losartan (COZAAR) 25 MG tablet; Take one tablet daily and can take another tablet if BP above 140/90.  Microalbuminuria -     losartan (COZAAR) 25 MG tablet; Take one tablet daily and can take another tablet if BP above 140/90.  Anxiety -     ALPRAZolam (XANAX) 0.5 MG tablet; Take 1 tablet (0.5 mg total) by mouth 2 (two) times daily as needed for anxiety.  Adjustment disorder with mixed anxiety and depressed mood -     ALPRAZolam (XANAX) 0.5 MG tablet; Take 1 tablet (0.5 mg total) by mouth 2 (two) times daily as needed for anxiety.  Coronary artery disease involving coronary  bypass graft of native heart without angina pectoris  History of MI (myocardial infarction)  Mucopurulent chronic bronchitis (HCC) -     budesonide-formoterol (SYMBICORT) 160-4.5 MCG/ACT inhaler; Inhale 2 puffs into the lungs 2 (two) times daily.  Cigarette nicotine dependence without complication   A1C not to goal Add glipizide  Increase basaglar to 15 units at bedtime with instructions to increase by 2 units every 5 days until fasting glucose is under 130 On cozaar, BP to goal On statin Declined all vaccines  Chronic bronchitis-start symbicort  Xanax to use sparingly  Tandy Gaw, PA-C

## 2023-11-17 NOTE — Patient Instructions (Addendum)
Continue metformin  Add glipizide Increase basaglar to 15 units at bedtime then increase by 2 units every 5 days until fasting glucose under 130.  Can take another cozaar if BP above 140/90.

## 2023-11-22 ENCOUNTER — Encounter: Payer: Self-pay | Admitting: Physician Assistant

## 2023-11-23 ENCOUNTER — Other Ambulatory Visit: Payer: Self-pay | Admitting: Physician Assistant

## 2023-11-23 DIAGNOSIS — E1165 Type 2 diabetes mellitus with hyperglycemia: Secondary | ICD-10-CM

## 2023-11-23 MED ORDER — GLUCOSE BLOOD VI STRP: ORAL_STRIP | 12 refills | Status: AC

## 2023-11-24 ENCOUNTER — Ambulatory Visit (INDEPENDENT_AMBULATORY_CARE_PROVIDER_SITE_OTHER): Payer: Self-pay | Admitting: Physician Assistant

## 2023-11-24 ENCOUNTER — Encounter: Payer: Self-pay | Admitting: Physician Assistant

## 2023-11-24 VITALS — BP 111/67 | HR 98 | Temp 98.2°F | Resp 20 | Ht 67.0 in | Wt 213.0 lb

## 2023-11-24 DIAGNOSIS — R140 Abdominal distension (gaseous): Secondary | ICD-10-CM

## 2023-11-24 DIAGNOSIS — R1013 Epigastric pain: Secondary | ICD-10-CM

## 2023-11-24 DIAGNOSIS — R197 Diarrhea, unspecified: Secondary | ICD-10-CM

## 2023-11-24 DIAGNOSIS — R112 Nausea with vomiting, unspecified: Secondary | ICD-10-CM

## 2023-11-24 DIAGNOSIS — R1084 Generalized abdominal pain: Secondary | ICD-10-CM

## 2023-11-24 MED ORDER — LIDOCAINE VISCOUS HCL 2 % MT SOLN
15.0000 mL | Freq: Once | OROMUCOSAL | Status: AC
  Administered 2023-11-24: 15 mL via OROMUCOSAL

## 2023-11-24 MED ORDER — ONDANSETRON 8 MG PO TBDP: 8.0000 mg | ORAL_TABLET | Freq: Three times a day (TID) | ORAL | 1 refills | Status: DC | PRN

## 2023-11-24 MED ORDER — PROMETHAZINE HCL 12.5 MG PO TABS: 12.5000 mg | ORAL_TABLET | Freq: Three times a day (TID) | ORAL | 0 refills | Status: DC | PRN

## 2023-11-24 MED ORDER — ALUM & MAG HYDROXIDE-SIMETH 200-200-20 MG/5ML PO SUSP
30.0000 mL | Freq: Once | ORAL | Status: AC
  Administered 2023-11-24: 30 mL via ORAL

## 2023-11-24 MED ORDER — HYOSCYAMINE SULFATE 0.125 MG PO TBDP
0.1250 mg | ORAL_TABLET | Freq: Once | ORAL | Status: AC
  Administered 2023-11-24: 0.125 mg via SUBLINGUAL

## 2023-11-24 MED ORDER — OMEPRAZOLE 40 MG PO CPDR: 40.0000 mg | DELAYED_RELEASE_CAPSULE | Freq: Every day | ORAL | 3 refills | Status: AC

## 2023-11-24 MED ORDER — ALUM & MAG HYDROXIDE-SIMETH 400-400-40 MG/5ML PO SUSP: 30.0000 mL | Freq: Once | ORAL | Status: DC

## 2023-11-24 NOTE — Patient Instructions (Addendum)
Start omeprazole daily Zofran and phenergan for nausea  Acute Pancreatitis  Acute pancreatitis happens when a gland called the pancreas suddenly develops inflammation, making it irritated and swollen. The pancreas is found on the left side of the abdomen, behind the stomach. The pancreas makes proteins (enzymes) that help to digest food. It also releases the hormones glucagon and insulin. These help to regulate blood sugar. Most sudden (acute) attacks of this condition last a few days and can cause serious problems. Some people become dehydrated and develop low blood pressure. In severe cases, bleeding in the abdomen can lead to shock and can be life-threatening. The lungs, heart, and kidneys may stop working. What are the causes? This condition may be caused by: Heavy alcohol use. Drug use. Gallstones or other conditions that can block the tube that drains the pancreas (pancreatic duct). A tumor in the pancreas. Other causes include: Being exposed to certain medicines or certain chemicals. Having health conditions such as diabetes, high triglycerides, or high calcium levels in your blood. High calcium levels are usually caused by the parathyroid gland being too active. An infection in the pancreas. Damage caused by an accident (trauma) or by the poison (venom) of a scorpion sting. Abdominal surgery. Autoimmune pancreatitis. This is when the body's disease-fighting system (immune system) attacks the pancreas. Genes that are passed from parent to child (inherited). In some cases, the cause of this condition is not known. What are the signs or symptoms? Symptoms of this condition include: Pain in the upper abdomen that may spread (radiate) to the back. Pain may be severe and often worsens after you eat. A tender and swollen abdomen. Nausea and vomiting. Fever. How is this diagnosed? This condition may be diagnosed based on: A physical exam. Blood tests. These include an increased  (elevated) level of lipase or amylase. Imaging tests, such as CT scans, MRIs, or an ultrasound of the abdomen. How is this treated? Treatment for this condition often requires a hospital stay and may include: Pain medicine. IV fluids. Placing a tube in the stomach to remove stomach contents and to control vomiting (nasogastric tube, or NG tube). Not eating until vomiting has lessened. Treating any underlying conditions that may be the cause. Treatment may include: Antibiotic medicines, if your condition is caused by an infection. Steroid medicine, if your condition is caused by your immune system attacking your pancreas (autoimmune disease). Surgery on the gallbladder or pancreas, if your condition is caused by gallstones or another blockage. Follow these instructions at home: Medicines Take over-the-counter and prescription medicines only as told by your health care provider. If you were prescribed an antibiotic medicine, take it as told by your health care provider. Do not stop using the antibiotic even if you start to feel better. Ask your health care provider if the medicine prescribed to you: Requires you to avoid driving or using machinery. Can cause constipation. You may need to take these actions to prevent or treat constipation: Take over-the-counter or prescription medicines. Eat foods that are high in fiber, such as beans, whole grains, and fresh fruits and vegetables. Limit foods that are high in fat and processed sugars, such as fried or sweet foods. Eating and drinking  Follow instructions from your health care provider about diet. This may involve avoiding alcohol and having less fat in your diet. Eat smaller, more frequent meals. Doing this causes the pancreas to make less digestive fluid. Drink enough fluid to keep your urine pale yellow. Do not drink alcohol if  it caused your condition. General instructions Do not use any products that contain nicotine or tobacco. These  products include cigarettes, chewing tobacco, and vaping devices, such as e-cigarettes. If you need help quitting, ask your health care provider. Get plenty of rest. If directed, check your blood sugar at home as told by your health care provider. Keep all follow-up visits. This is important. Contact a health care provider if: You do not get better as fast as expected. Your symptoms get worse or you get new symptoms. You keep having pain, weakness, or nausea. You get better and then pain comes back. You have a fever. Get help right away if: You vomit every time you eat or drink. Your pain becomes severe. Your skin or the white parts of your eyes turn yellow (jaundice). You have sudden swelling in your abdomen. You feel dizzy or you faint. Your blood sugar is high (over 300 mg/dL). You vomit blood. These symptoms may be an emergency. Get help right away. Call 911. Do not wait to see if the symptoms will go away. Do not drive yourself to the hospital. Summary Acute pancreatitis happens when inflammation of the pancreas suddenly occurs and the pancreas becomes irritated and swollen. This condition is typically caused by heavy alcohol use, drug use, or gallstones. Treatment for this condition usually requires a stay in the hospital. This information is not intended to replace advice given to you by your health care provider. Make sure you discuss any questions you have with your health care provider. Document Revised: 09/02/2021 Document Reviewed: 09/02/2021 Elsevier Patient Education  2024 [employer].

## 2023-11-25 LAB — CBC WITH DIFFERENTIAL/PLATELET
Basophils Absolute: 0 10*3/uL (ref 0.0–0.2)
Basos: 1 %
EOS (ABSOLUTE): 0.1 10*3/uL (ref 0.0–0.4)
Eos: 1 %
Hematocrit: 41.2 % (ref 34.0–46.6)
Hemoglobin: 13.8 g/dL (ref 11.1–15.9)
Immature Grans (Abs): 0 10*3/uL (ref 0.0–0.1)
Immature Granulocytes: 0 %
Lymphocytes Absolute: 2.2 10*3/uL (ref 0.7–3.1)
Lymphs: 29 %
MCH: 28.3 pg (ref 26.6–33.0)
MCHC: 33.5 g/dL (ref 31.5–35.7)
MCV: 85 fL (ref 79–97)
Monocytes Absolute: 0.4 10*3/uL (ref 0.1–0.9)
Monocytes: 5 %
Neutrophils Absolute: 4.9 10*3/uL (ref 1.4–7.0)
Neutrophils: 64 %
Platelets: 279 10*3/uL (ref 150–450)
RBC: 4.87 x10E6/uL (ref 3.77–5.28)
RDW: 12.8 % (ref 11.7–15.4)
WBC: 7.7 10*3/uL (ref 3.4–10.8)

## 2023-11-25 LAB — CMP14+EGFR
ALT: 19 [IU]/L (ref 0–32)
AST: 13 [IU]/L (ref 0–40)
Albumin: 4.1 g/dL (ref 3.8–4.9)
Alkaline Phosphatase: 148 [IU]/L — ABNORMAL HIGH (ref 44–121)
BUN/Creatinine Ratio: 15 (ref 9–23)
BUN: 10 mg/dL (ref 6–24)
Bilirubin Total: 0.6 mg/dL (ref 0.0–1.2)
CO2: 21 mmol/L (ref 20–29)
Calcium: 9.1 mg/dL (ref 8.7–10.2)
Chloride: 101 mmol/L (ref 96–106)
Creatinine, Ser: 0.67 mg/dL (ref 0.57–1.00)
Globulin, Total: 2.5 g/dL (ref 1.5–4.5)
Glucose: 203 mg/dL — ABNORMAL HIGH (ref 70–99)
Potassium: 4.1 mmol/L (ref 3.5–5.2)
Sodium: 137 mmol/L (ref 134–144)
Total Protein: 6.6 g/dL (ref 6.0–8.5)
eGFR: 102 mL/min/{1.73_m2} (ref >59)

## 2023-11-25 LAB — LIPASE: Lipase: 24 U/L (ref 14–72)

## 2023-11-26 ENCOUNTER — Encounter: Payer: Self-pay | Admitting: Physician Assistant

## 2023-11-26 MED ORDER — METRONIDAZOLE 500 MG PO TABS: 500.0000 mg | ORAL_TABLET | Freq: Three times a day (TID) | ORAL | 0 refills | Status: DC

## 2023-11-26 MED ORDER — CIPROFLOXACIN HCL 500 MG PO TABS: 500.0000 mg | ORAL_TABLET | Freq: Two times a day (BID) | ORAL | 0 refills | Status: DC

## 2023-11-26 NOTE — Progress Notes (Signed)
GREAT news. No sign of pancreatitis.  Kidney function looks great.  WBC normal.  Looks to be viral or gastritis.  How are you feeling?

## 2023-11-29 ENCOUNTER — Encounter: Payer: Self-pay | Admitting: Physician Assistant

## 2023-11-29 NOTE — Progress Notes (Addendum)
Acute Office Visit  Subjective:     Patient ID: Denise Tyler, female    DOB: 07-Jul-1966, 58 y.o.   MRN: [id number]  Chief Complaint  Patient presents with   Emesis   Fatigue   Excessive Sweating    Cold to the touch     HPI Patient is in today for generalized abdominal pain, nausea, vomiting, burping, diarrhea for the last week. She seems to be getting a little better but has not been able to eat at all and abdomen still tender. Took glipizide once and made symptoms worse and glucose drop. No fever, chills. She has felt achy. No other sick contacts in home. Denies any melena or hematochezia.   [name] Kitchen. Active Ambulatory Problems    Diagnosis Date Noted   ANKLE PAIN 02/02/2011   GERD (gastroesophageal reflux disease) 11/08/2013   Epigastric pain 11/08/2013   Uncontrolled diabetes mellitus (HCC) 11/08/2013   Hepatic steatosis 11/27/2014   Adenoma of left adrenal gland 11/27/2014   Constipation 11/27/2014   Type 2 diabetes mellitus with hyperglycemia, with long-term current use of insulin (HCC) 01/27/2016   Onychomycosis 01/27/2016   Costovertebral angle tenderness 03/30/2016   GAD (generalized anxiety disorder) 12/25/2016   Insomnia 12/25/2016   Polyarthralgia 12/25/2016   Hypertriglyceridemia 01/01/2017   Right hip pain 07/11/2017   Hyperlipidemia associated with type 2 diabetes mellitus (HCC) 07/13/2017   Primary osteoarthritis of right hip 07/13/2017   Trochanteric bursitis of right hip 01/06/2018   Right knee pain 01/06/2018   No energy 01/30/2018   Chronic pain of both knees 01/30/2018   Tobacco abuse 12/05/2018   Tobacco dependence 12/25/2019   Right upper quadrant abdominal pain 01/30/2020   Depression, major, single episode, mild (HCC) 02/06/2020   Degenerative arthritis of thumb, left 11/05/2020   Degenerative arthritis of thumb, right 11/05/2020   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HCC) 11/05/2020   Non compliance w medication regimen 11/05/2020   Bilateral  thumb pain 11/05/2020   Anxiety 11/05/2020   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-sided sciatica 11/05/2020   Coronary artery disease involving coronary bypass graft of native heart without angina pectoris 02/19/2021   Neuropathy due to secondary diabetes (HCC) 02/19/2021   Gross hematuria 02/19/2021   Diabetes mellitus with hemoglobin A1c goal of 7.0%-8.0% (HCC) 02/19/2021   Depressed mood 02/19/2021   NSTEMI (non-ST elevated myocardial infarction) (HCC) 02/19/2021   Class 1 obesity due to excess calories with serious comorbidity and body mass index (BMI) of 33.0 to 33.9 in adult 02/19/2021   PTSD (post-traumatic stress disorder) 02/19/2021   Stress 10/03/2021   Chronic left shoulder pain 04/01/2022   CAD, multiple vessel 09/16/2022   Primary hypertension 09/16/2022   COPD (chronic obstructive pulmonary disease) (HCC) 01/05/2023   Adjustment disorder with mixed anxiety and depressed mood 04/09/2023   Microalbuminuria 08/17/2023   Current smoker 08/18/2023   Uncontrolled hypertension 08/18/2023   History of MI (myocardial infarction) 08/27/2023   Cigarette nicotine dependence without complication 11/17/2023   Resolved Ambulatory Problems    Diagnosis Date Noted   Black tarry stools 03/30/2016   Past Medical History:  Diagnosis Date   Diabetes mellitus without complication (HCC)    Mini stroke      ROS See HPI.      Objective:    BP 111/67 (BP Location: Left Arm, Cuff Size: Normal)   Pulse 98   Temp 98.2 F (36.8 C) (Oral)   Resp 20   Ht 5\' 7"  (1.702 m)   Wt 213  lb 0.6 oz (96.6 kg)   SpO2 97%   BMI 33.37 kg/m  BP Readings from Last 3 Encounters:  11/24/23 111/67  11/17/23 114/74  10/29/23 128/78   Wt Readings from Last 3 Encounters:  11/24/23 213 lb 0.6 oz (96.6 kg)  11/17/23 210 lb (95.3 kg)  08/25/23 210 lb (95.3 kg)      Physical Exam Constitutional:      Appearance: She is ill-appearing.  HENT:     Head: Normocephalic.     Nose: No  congestion or rhinorrhea.  Cardiovascular:     Rate and Rhythm: Normal rate and regular rhythm.     Pulses: Normal pulses.     Heart sounds: Normal heart sounds.  Pulmonary:     Effort: Pulmonary effort is normal.  Abdominal:     General: There is no distension.     Palpations: Abdomen is soft. There is no mass.     Tenderness: There is abdominal tenderness. There is no right CVA tenderness, left CVA tenderness, guarding or rebound.     Hernia: No hernia is present.     Comments: Generalized abdominal tenderness  Musculoskeletal:     Right lower leg: No edema.     Left lower leg: No edema.  Neurological:     Mental Status: She is alert.           Assessment & Plan:  [name] KitchenMarland KitchenEugenie was seen today for emesis, fatigue and excessive sweating.  Diagnoses and all orders for this visit:  Generalized abdominal pain -     Lipase -     CMP14+EGFR -     CBC w/Diff/Platelet -     lidocaine (XYLOCAINE) 2 % viscous mouth solution 15 mL -     hyoscyamine (ANASPAZ) disintergrating tablet 0.125 mg -     Discontinue: alum & mag hydroxide-simeth (MAALOX PLUS) 400-400-40 MG/5ML suspension 30 mL -     alum & mag hydroxide-simeth (MAALOX/MYLANTA) 200-200-20 MG/5ML suspension 30 mL  Nausea and vomiting, unspecified vomiting type -     Lipase -     CMP14+EGFR -     CBC w/Diff/Platelet -     promethazine (PHENERGAN) 12.5 MG tablet; Take 1 tablet (12.5 mg total) by mouth every 8 (eight) hours as needed for nausea or vomiting. -     ondansetron (ZOFRAN-ODT) 8 MG disintegrating tablet; Take 1 tablet (8 mg total) by mouth every 8 (eight) hours as needed. -     hyoscyamine (ANASPAZ) disintergrating tablet 0.125 mg  Diarrhea, unspecified type -     Lipase -     CMP14+EGFR -     CBC w/Diff/Platelet -     Discontinue: alum & mag hydroxide-simeth (MAALOX PLUS) 400-400-40 MG/5ML suspension 30 mL -     alum & mag hydroxide-simeth (MAALOX/MYLANTA) 200-200-20 MG/5ML suspension 30 mL  Abdominal bloating -      Lipase -     CMP14+EGFR -     CBC w/Diff/Platelet -     Discontinue: alum & mag hydroxide-simeth (MAALOX PLUS) 400-400-40 MG/5ML suspension 30 mL -     alum & mag hydroxide-simeth (MAALOX/MYLANTA) 200-200-20 MG/5ML suspension 30 mL  Epigastric pain -     Lipase -     CMP14+EGFR -     CBC w/Diff/Platelet -     omeprazole (PRILOSEC) 40 MG capsule; Take 1 capsule (40 mg total) by mouth daily. -     lidocaine (XYLOCAINE) 2 % viscous mouth solution 15 mL   Unclear etiology gastroenteritis vs pancreatitis  vs cholecystitis vs diverticulitis vs colitis.  Pt does not have insurance and trying to avoid imaging Lipase,cmp, cbc ordered today GI cocktail given Start omeprazole daily Zofran and phenergan as needed for nausea BRAT diet Hold glipizide until GI issues resolved.  Follow up if symptoms not resolving or worsening   Tandy Gaw, PA-C

## 2023-12-20 ENCOUNTER — Ambulatory Visit: Payer: No Typology Code available for payment source

## 2023-12-29 ENCOUNTER — Ambulatory Visit: Payer: Self-pay | Admitting: Physician Assistant

## 2023-12-30 ENCOUNTER — Encounter: Payer: Self-pay | Admitting: Medical-Surgical

## 2023-12-30 ENCOUNTER — Ambulatory Visit: Payer: Self-pay

## 2023-12-30 ENCOUNTER — Ambulatory Visit: Payer: Self-pay | Admitting: Medical-Surgical

## 2023-12-30 VITALS — BP 127/85 | HR 85 | Temp 98.3°F | Ht 67.0 in | Wt 213.0 lb

## 2023-12-30 DIAGNOSIS — R112 Nausea with vomiting, unspecified: Secondary | ICD-10-CM

## 2023-12-30 DIAGNOSIS — R1012 Left upper quadrant pain: Secondary | ICD-10-CM

## 2023-12-30 NOTE — Progress Notes (Signed)
        Established patient visit  History, exam, impression, and plan:  1. Left upper quadrant abdominal pain (Primary) Pleasant 58 year old female presenting with history of abdominal pain that has been ongoing yet intermittent.  Notes that her abdomen is affected from the epigastric area to the left upper and left lower quadrants.  The pain wraps around her flank and into her back on the left side.  This flare occurred on Monday night.  She has had nausea with vomiting, excessive burping, and abdominal distention.  Unable to tolerate lying down and the pain is difficult to tolerate while sitting as well.  Notes that standing upright helps the pain.  She has had a cholecystectomy in the past.  No diarrhea and her last bowel movement was small on Sunday.  Her pain right now is a 6/10 but last night it was 7-8/10 and she almost went to the emergency room.  Experiencing significant fatigue on top of her other symptoms but no fevers, chills, or known sick contacts.  On evaluation, bowel sounds positive x 4 quadrants.  Mild abdominal distention noted throughout.  Right upper quadrant nontender with mild right lower quadrant tenderness.  Left upper and lower quadrant severe tenderness with moderate pressure palpation.  Notes abnormal skin sensation in the area around the flank and back in addition to pain.  Rebound tenderness noted in the left quadrants.  No HSM or abdominal mass palpable.  Unclear etiology.  Checking labs as below.  Would like to get an H. pylori breath test today however with her unpredictable nausea and tolerance for food, suspect that this may cause more harm than good.  Plan for CT of the abdomen and pelvis without contrast for further evaluation. - CBC with Differential/Platelet - CMP14+EGFR - Amylase - Lipase - H. pylori breath test - CT ABDOMEN PELVIS WO CONTRAST; Future  Procedures performed this visit: None.  Return if symptoms worsen or fail to  improve.  __________________________________ Thayer Ohm, DNP, APRN, FNP-BC Primary Care and Sports Medicine [facility] [place]

## 2023-12-31 ENCOUNTER — Encounter: Payer: Self-pay | Admitting: Medical-Surgical

## 2023-12-31 LAB — CBC WITH DIFFERENTIAL/PLATELET
Basophils Absolute: 0.1 10*3/uL (ref 0.0–0.2)
Basos: 1 %
EOS (ABSOLUTE): 0.2 10*3/uL (ref 0.0–0.4)
Eos: 3 %
Hematocrit: 41.9 % (ref 34.0–46.6)
Hemoglobin: 14 g/dL (ref 11.1–15.9)
Immature Grans (Abs): 0 10*3/uL (ref 0.0–0.1)
Immature Granulocytes: 0 %
Lymphocytes Absolute: 1.9 10*3/uL (ref 0.7–3.1)
Lymphs: 27 %
MCH: 28.5 pg (ref 26.6–33.0)
MCHC: 33.4 g/dL (ref 31.5–35.7)
MCV: 85 fL (ref 79–97)
Monocytes Absolute: 0.5 10*3/uL (ref 0.1–0.9)
Monocytes: 7 %
Neutrophils Absolute: 4.5 10*3/uL (ref 1.4–7.0)
Neutrophils: 62 %
Platelets: 231 10*3/uL (ref 150–450)
RBC: 4.92 x10E6/uL (ref 3.77–5.28)
RDW: 13.1 % (ref 11.7–15.4)
WBC: 7.3 10*3/uL (ref 3.4–10.8)

## 2023-12-31 LAB — CMP14+EGFR
ALT: 21 IU/L (ref 0–32)
AST: 14 IU/L (ref 0–40)
Albumin: 4.5 g/dL (ref 3.8–4.9)
Alkaline Phosphatase: 131 IU/L — ABNORMAL HIGH (ref 44–121)
BUN/Creatinine Ratio: 17 (ref 9–23)
BUN: 12 mg/dL (ref 6–24)
Bilirubin Total: 0.5 mg/dL (ref 0.0–1.2)
CO2: 21 mmol/L (ref 20–29)
Calcium: 9.4 mg/dL (ref 8.7–10.2)
Chloride: 101 mmol/L (ref 96–106)
Creatinine, Ser: 0.72 mg/dL (ref 0.57–1.00)
Globulin, Total: 2.3 g/dL (ref 1.5–4.5)
Glucose: 219 mg/dL — ABNORMAL HIGH (ref 70–99)
Potassium: 3.9 mmol/L (ref 3.5–5.2)
Sodium: 138 mmol/L (ref 134–144)
Total Protein: 6.8 g/dL (ref 6.0–8.5)
eGFR: 97 mL/min/{1.73_m2} (ref >59)

## 2023-12-31 LAB — LIPASE: Lipase: 24 U/L (ref 14–72)

## 2023-12-31 LAB — AMYLASE: Amylase: 34 U/L (ref 31–110)

## 2024-01-06 ENCOUNTER — Other Ambulatory Visit: Payer: Self-pay | Admitting: Medical-Surgical

## 2024-01-06 DIAGNOSIS — R1012 Left upper quadrant pain: Secondary | ICD-10-CM

## 2024-01-06 DIAGNOSIS — R112 Nausea with vomiting, unspecified: Secondary | ICD-10-CM

## 2024-01-06 DIAGNOSIS — R1084 Generalized abdominal pain: Secondary | ICD-10-CM

## 2024-01-21 ENCOUNTER — Ambulatory Visit: Payer: Self-pay | Admitting: Physician Assistant

## 2024-01-21 ENCOUNTER — Encounter: Payer: Self-pay | Admitting: Physician Assistant

## 2024-01-21 VITALS — BP 127/67 | HR 94 | Temp 98.6°F | Ht 67.0 in | Wt 213.0 lb

## 2024-01-21 DIAGNOSIS — R6889 Other general symptoms and signs: Secondary | ICD-10-CM

## 2024-01-21 DIAGNOSIS — J441 Chronic obstructive pulmonary disease with (acute) exacerbation: Secondary | ICD-10-CM

## 2024-01-21 MED ORDER — PREDNISONE 20 MG PO TABS: ORAL_TABLET | ORAL | 0 refills | Status: DC

## 2024-01-21 MED ORDER — DOXYCYCLINE HYCLATE 100 MG PO TABS: 100.0000 mg | ORAL_TABLET | Freq: Two times a day (BID) | ORAL | 0 refills | Status: DC

## 2024-01-21 MED ORDER — IPRATROPIUM-ALBUTEROL 0.5-2.5 (3) MG/3ML IN SOLN
3.0000 mL | Freq: Once | RESPIRATORY_TRACT | Status: AC
  Administered 2024-01-21: 3 mL via RESPIRATORY_TRACT

## 2024-01-21 NOTE — Progress Notes (Signed)
 Acute Office Visit  Subjective:     Patient ID: Denise Tyler, female    DOB: Mar 20, 1966, 58 y.o.   MRN: [id number]  Chief Complaint  Patient presents with   Cough    HPI Patient is in today for cough, SOB, chest tightness for over a week. She feels like she has been staying sick recently. She is using her albuterol inhaler 3-4 times a  day. She continues to smoke. She has not had a fever, chills, body aches. Her cough is productive green to brown and tinge of blood in sputum.   .. Active Ambulatory Problems    Diagnosis Date Noted   ANKLE PAIN 02/02/2011   GERD (gastroesophageal reflux disease) 11/08/2013   Epigastric pain 11/08/2013   Uncontrolled diabetes mellitus (HCC) 11/08/2013   Hepatic steatosis 11/27/2014   Adenoma of left adrenal gland 11/27/2014   Constipation 11/27/2014   Type 2 diabetes mellitus with hyperglycemia, with long-term current use of insulin (HCC) 01/27/2016   Onychomycosis 01/27/2016   Costovertebral angle tenderness 03/30/2016   GAD (generalized anxiety disorder) 12/25/2016   Insomnia 12/25/2016   Polyarthralgia 12/25/2016   Hypertriglyceridemia 01/01/2017   Right hip pain 07/11/2017   Hyperlipidemia associated with type 2 diabetes mellitus (HCC) 07/13/2017   Primary osteoarthritis of right hip 07/13/2017   Trochanteric bursitis of right hip 01/06/2018   Right knee pain 01/06/2018   No energy 01/30/2018   Chronic pain of both knees 01/30/2018   Tobacco abuse 12/05/2018   Tobacco dependence 12/25/2019   Right upper quadrant abdominal pain 01/30/2020   Depression, major, single episode, mild (HCC) 02/06/2020   Degenerative arthritis of thumb, left 11/05/2020   Degenerative arthritis of thumb, right 11/05/2020   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HCC) 11/05/2020   Non compliance w medication regimen 11/05/2020   Bilateral thumb pain 11/05/2020   Anxiety 11/05/2020   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-sided sciatica 11/05/2020    Coronary artery disease involving coronary bypass graft of native heart without angina pectoris 02/19/2021   Neuropathy due to secondary diabetes (HCC) 02/19/2021   Gross hematuria 02/19/2021   Diabetes mellitus with hemoglobin A1c goal of 7.0%-8.0% (HCC) 02/19/2021   Depressed mood 02/19/2021   NSTEMI (non-ST elevated myocardial infarction) (HCC) 02/19/2021   Class 1 obesity due to excess calories with serious comorbidity and body mass index (BMI) of 33.0 to 33.9 in adult 02/19/2021   PTSD (post-traumatic stress disorder) 02/19/2021   Stress 10/03/2021   Chronic left shoulder pain 04/01/2022   CAD, multiple vessel 09/16/2022   Primary hypertension 09/16/2022   COPD (chronic obstructive pulmonary disease) (HCC) 01/05/2023   Adjustment disorder with mixed anxiety and depressed mood 04/09/2023   Microalbuminuria 08/17/2023   Current smoker 08/18/2023   Uncontrolled hypertension 08/18/2023   History of MI (myocardial infarction) 08/27/2023   Cigarette nicotine dependence without complication 11/17/2023   Resolved Ambulatory Problems    Diagnosis Date Noted   Black tarry stools 03/30/2016   Past Medical History:  Diagnosis Date   Diabetes mellitus without complication (HCC)    Mini stroke      ROS See HPI.     Objective:    BP 127/67   Pulse 94   Temp 98.6 F (37 C) (Oral)   Ht 5\' 7"  (1.702 m)   Wt 213 lb (96.6 kg)   SpO2 99%   BMI 33.36 kg/m  BP Readings from Last 3 Encounters:  01/21/24 127/67  12/30/23 127/85  11/24/23 111/67   Wt Readings from  Last 3 Encounters:  01/21/24 213 lb (96.6 kg)  12/30/23 213 lb (96.6 kg)  11/24/23 213 lb 0.6 oz (96.6 kg)      Physical Exam Constitutional:      Appearance: Normal appearance. [name].  HENT:     Head: Normocephalic.     Right Ear: Tympanic membrane, ear canal and external ear normal. There is no impacted cerumen.     Left Ear: Tympanic membrane, ear canal and external ear normal. There is no impacted  cerumen.     Nose: Nose normal.     Mouth/Throat:     Mouth: Mucous membranes are moist.     Pharynx: No posterior oropharyngeal erythema.  Eyes:     Conjunctiva/sclera: Conjunctivae normal.  Cardiovascular:     Rate and Rhythm: Normal rate and regular rhythm.     Pulses: Normal pulses.  Pulmonary:     Breath sounds: Wheezing and rhonchi present.     Comments: Coarse crackles with wheezing throughout both lungs Musculoskeletal:     Cervical back: Normal range of motion and neck supple. No tenderness.     Right lower leg: No edema.     Left lower leg: No edema.  Lymphadenopathy:     Cervical: No cervical adenopathy.  Neurological:     General: No focal deficit present.     Mental Status: She is alert and oriented to person, place, and time.  Psychiatric:        Mood and Affect: Mood normal.         Assessment & Plan:  [name] KitchenMarland KitchenJamelah was seen today for cough.  Diagnoses and all orders for this visit:  COPD exacerbation (HCC) -     doxycycline (VIBRA-TABS) 100 MG tablet; Take 1 tablet (100 mg total) by mouth 2 (two) times daily. -     predniSONE (DELTASONE) 20 MG tablet; Take 2 tablets every morning for 5 days. -     ipratropium-albuterol (DUONEB) 0.5-2.5 (3) MG/3ML nebulizer solution 3 mL  Other orders -     Cancel: POCT Influenza A/B -     Cancel: POC COVID-19 -     Cancel: POCT rapid strep A   Not for work given for days missed Duoneb given in office Prednisone and doxycycline sent to pharmacy for exacerbation Continue symbicort daily- would prefer Breztri but cost is an issue right now Continue albuterol as needed  Discussed urgency of stopping smoking Start patches and cut down by 8 cigs a day   [employer], PA-C

## 2024-01-21 NOTE — Patient Instructions (Addendum)
 Start doxycycline and prednisone  Chronic Obstructive Pulmonary Disease Exacerbation  Chronic obstructive pulmonary disease (COPD) is a long-term (chronic) lung problem. When you have COPD, it can feel harder to breathe in or out. COPD exacerbation is a flare-up of symptoms when breathing gets worse and more treatment may be needed. Without treatment, flare-ups can be life-threatening. If they happen often, your lungs can become more damaged. What are the causes? Not taking your usual COPD medicines as told by your health care provider. A cold or the flu, which can cause infection in your lungs. Being exposed to things that make your breathing worse, such as: Smoke. Air pollution. Fumes. Dust. Allergies. Weather changes. What are the signs or symptoms? Symptoms do not get better or get worse even if you take your medicines as told by your provider. Symptoms may include: More shortness of breath. You may only be able to speak one or two words at a time. More coughing or mucus from your lungs. More wheezing or chest tightness. Being more tired and having less energy. Confusion. How is this diagnosed? This condition is diagnosed based on: Symptoms that get worse. Your medical history. A physical exam. You may also have tests, including: A chest X-ray. Blood or mucus tests. How is this treated? You may be able to stay home or you may need to go to the hospital. Treatment may include: Taking medicines. These may include: Inhalers. These have medicines in them that you breathe in. These may be more of what you already take or they may be new. Steroids. These reduce inflammation in the airways. These may be inhaled, taken by mouth, or given in an IV. Antibiotics. These treat infection. Using oxygen. Using a device to help you clear mucus. Follow these instructions at home: Medicines Take your medicines only as told by your provider. If you were given antibiotics or steroids, take  them as told by your provider. Do not stop taking them even if you start to feel better. Lifestyle Several times a day, wash your hands with soap and water for at least 20 seconds. If you cannot use soap and water, use hand sanitizer. This may help keep you from getting an infection. Avoid being around crowds or people who are sick. Do not smoke or use any products that contain nicotine or tobacco. If you need help quitting, ask your provider. Return to your normal activities when your provider says that it's safe. Use breathing methods to control your stress and catch your breath. How is this prevented? Follow your COPD action plan. The action plan tells you what to do if you're feeling good and what to do when you start feeling worse. Discuss the plan often with your provider. Make sure you get all the shots, also called vaccines, that your provider recommends. Ask your provider about a flu shot and a pneumonia shot. Use oxygen therapy if told by your provider. If you need home oxygen therapy, ask your provider how often to check your oxygen level with a device called an oximeter. Keep all follow-up visits to review your COPD action plan. Your provider will want to check on your condition often to keep you healthy and out of the hospital. Contact a health care provider if: Your COPD symptoms get worse. You have a fever or chills. You have trouble doing daily activities. You have trouble breathing even when you are resting. Get help right away if: You are short of breath and cannot: Talk in full sentences. Do  normal activities. You have chest pain. You feel confused. These symptoms may be an emergency. Call 911 right away. Do not wait to see if the symptoms will go away. Do not drive yourself to the hospital. This information is not intended to replace advice given to you by your health care provider. Make sure you discuss any questions you have with your health care provider. Document  Revised: 07/15/2023 Document Reviewed: 12/28/2022 Elsevier Patient Education  2024 [employer].

## 2024-01-24 ENCOUNTER — Encounter: Payer: Self-pay | Admitting: Physician Assistant

## 2024-03-27 ENCOUNTER — Encounter: Payer: Self-pay | Admitting: Medical-Surgical

## 2024-05-16 ENCOUNTER — Ambulatory Visit (INDEPENDENT_AMBULATORY_CARE_PROVIDER_SITE_OTHER): Payer: Self-pay | Admitting: Family Medicine

## 2024-05-16 ENCOUNTER — Ambulatory Visit: Payer: Self-pay

## 2024-05-16 ENCOUNTER — Other Ambulatory Visit: Payer: Self-pay

## 2024-05-16 ENCOUNTER — Encounter: Payer: Self-pay | Admitting: Family Medicine

## 2024-05-16 VITALS — BP 121/74 | HR 73 | Temp 97.6°F | Ht 67.0 in | Wt 211.0 lb

## 2024-05-16 DIAGNOSIS — T63461A Toxic effect of venom of wasps, accidental (unintentional), initial encounter: Secondary | ICD-10-CM | POA: Insufficient documentation

## 2024-05-16 DIAGNOSIS — E1165 Type 2 diabetes mellitus with hyperglycemia: Secondary | ICD-10-CM

## 2024-05-16 MED ORDER — TRULICITY 0.75 MG/0.5ML ~~LOC~~ SOAJ: 0.7500 mg | SUBCUTANEOUS | 0 refills | Status: DC

## 2024-05-16 MED ORDER — BASAGLAR TEMPO PEN 100 UNIT/ML ~~LOC~~ SOPN: PEN_INJECTOR | SUBCUTANEOUS | 0 refills | Status: DC

## 2024-05-16 MED ORDER — HYDROXYZINE PAMOATE 25 MG PO CAPS: 25.0000 mg | ORAL_CAPSULE | Freq: Three times a day (TID) | ORAL | 0 refills | Status: DC | PRN

## 2024-05-16 NOTE — Telephone Encounter (Signed)
 FYI Only or Action Required?: FYI only for provider.  Patient was last seen in primary care on 01/21/2024 by Antoniette Vermell CROME, PA-C.  Called Nurse Triage reporting Insect Bite.  Symptoms began Sunday.  Interventions attempted: OTC medications: itch cream & aleve .  Symptoms are: unchanged.  Triage Disposition: See PCP When Office is Open (Within 3 Days)  Patient/caregiver understands and will follow disposition?: Yes     Copied from CRM [id number]. Topic: Clinical - Red Word Triage >> May 16, 2024  8:22 AM Zane F wrote: [facility] that prompted transfer to Nurse Triage:   Stung by yellow jackets 14 times on Saturday; local swelling; nausea; really bad headache Reason for Disposition  [1] Hives, itching, or swelling elsewhere on body (i.e., not a site of stings) AND [2] started over 2 hours after sting  (Exception: Only at site of sting.)  Answer Assessment - Initial Assessment Questions 1. TYPE: What type of sting was it? (e.g., bee, yellow jacket, unknown)      Yellow jacket 2. ONSET: When did it occur?      Sunday 3. LOCATION: Where is the sting located?  How many stings?     Variety of areas: breasts, arms, groin, stomach, & hand 4. SWELLING SIZE: How big is the swelling? (e.g., inches or cm)     moderate 5. REDNESS: Is the area red or pink? If Yes, ask: What size is area of redness? (e.g., inches or cm). When did the redness start?     +yes 6. PAIN: Is there any pain? If Yes, ask: How bad is it?  (Scale 0-10; or none, mild, moderate, severe)     6/10 7. ITCHING: Is there any itching? If Yes, ask: How bad is it?      yes 8. RESPIRATORY DISTRESS: Describe your breathing.     no 9. PRIOR REACTIONS: Have you had any severe allergic reactions to stings in the past? If Yes, ask: What happened?     Yes, no reaction 10. OTHER SYMPTOMS: Do you have any other symptoms? (e.g., abdomen pain, face or tongue swelling, new rash elsewhere, vomiting)        Headache, nausea 11. PREGNANCY: Is there any chance you are pregnant? When was your last menstrual period?       na  Protocols used: Bee or Yellow Jacket Sting-A-AH

## 2024-05-16 NOTE — Assessment & Plan Note (Signed)
 Recommend staying well hydrated.  Can apply ice to areas.  Adding hydroxyzine  as needed for itching.  Red flags reviewed.

## 2024-05-16 NOTE — Progress Notes (Signed)
 Denise Tyler - 58 y.o. female MRN [id number]  Date of birth: [birth year]  Subjective Chief Complaint  Patient presents with   Insect Bite    HPI Denise Tyler is a 58 y.o. female here today with complaint of yellow jacket sting.    She was weed eating in her yard and went over a yellow jacket nest.  She was stung multiple times on her abdomen, chest, arms and upper legs.  She has not taken anything so far.  She did try benadryl cream.  She has had some chills and headache today.  Denies nausea, breathing difficulty, chest pain or lightheaded feeling.  She continues to have some residual itching.   ROS:  A comprehensive ROS was completed and negative except as noted per HPI  Allergies  Allergen Reactions   Metformin  And Related     diarrhea   Xigduo Xr [Dapagliflozin Pro-Metformin  Er]     Yeast infections    Past Medical History:  Diagnosis Date   Diabetes mellitus without complication (HCC)    Mini stroke    Primary hypertension 09/16/2022    Past Surgical History:  Procedure Laterality Date   ABDOMINAL HYSTERECTOMY     BLADDER SURGERY     CHOLECYSTECTOMY      Social History   Socioeconomic History   Marital status: Married    Spouse name: Not on file   Number of children: Not on file   Years of education: Not on file   Highest education level: Not on file  Occupational History   Not on file  Tobacco Use   Smoking status: Every Day    Current packs/day: 1.00    Types: Cigarettes   Smokeless tobacco: Never  Vaping Use   Vaping status: Never Used  Substance and Sexual Activity   Alcohol use: Yes   Drug use: No   Sexual activity: Yes  Other Topics Concern   Not on file  Social History Narrative   Not on file   Social Drivers of Health   Financial Resource Strain: Medium Risk (06/02/2023)   Received from [facility]   Overall Financial Resource Strain (CARDIA)    Difficulty of Paying Living Expenses: Somewhat hard  Food Insecurity: No Food Insecurity  (06/02/2023)   Received from [facility]   Hunger Vital Sign    Within the past 12 months, you worried that your food would run out before you got the money to buy more.: Never true    Within the past 12 months, the food you bought just didn't last and you didn't have money to get more.: Never true  Transportation Needs: No Transportation Needs (06/02/2023)   Received from [facility] - Transportation    Lack of Transportation (Medical): No    Lack of Transportation (Non-Medical): No  Physical Activity: Unknown (06/02/2023)   Received from [facility]   Exercise Vital Sign    On average, how many days per week do you engage in moderate to strenuous exercise (like a brisk walk)?: 0 days    Minutes of Exercise per Session: Not on file  Stress: Stress Concern Present (06/02/2023)   Received from [facility] - Occupational Stress Questionnaire    Feeling of Stress : To some extent  Social Connections: Moderately Integrated (06/02/2023)   Received from [facility]   Social Network    How would you rate your social network (family, work, friends)?: Adequate participation with social networks  Family History  Problem Relation Age of Onset   Heart attack Mother    Hypertension Mother    Cancer Other    Stroke Other    Cancer Maternal Aunt    Stroke Maternal Uncle    Stroke Maternal Grandfather     Health Maintenance  Topic Date Due   HIV Screening  Never done   Hepatitis C Screening  Never done   Hepatitis B Vaccines (1 of 3 - 19+ 3-dose series) Never done   OPHTHALMOLOGY EXAM  06/10/2023   FOOT EXAM  12/01/2023   Lung Cancer Screening  12/19/2023   HEMOGLOBIN A1C  05/16/2024   Cervical Cancer Screening (HPV/Pap Cotest)  08/17/2024 (Originally 06/08/2009)   Colonoscopy  11/16/2024 (Originally 06/19/2011)   INFLUENZA VACCINE  05/26/2024   MAMMOGRAM  07/09/2024   Diabetic kidney evaluation - Urine ACR  08/16/2024   Diabetic  kidney evaluation - eGFR measurement  12/29/2024   HPV VACCINES  Aged Out   Meningococcal B Vaccine  Aged Out   DTaP/Tdap/Td  Discontinued   Pneumococcal Vaccine 77-66 Years old  Discontinued   COVID-19 Vaccine  Discontinued   Zoster Vaccines- Shingrix  Discontinued     ----------------------------------------------------------------------------------------------------------------------------------------------------------------------------------------------------------------- Physical Exam BP 121/74 (BP Location: Right Arm, Patient Position: Sitting, Cuff Size: Large)   Pulse 73   Temp 97.6 F (36.4 C) (Oral)   Ht 5' 7 (1.702 m)   Wt 211 lb (95.7 kg)   SpO2 99%   BMI 33.05 kg/m   Physical Exam Constitutional:      Appearance: Normal appearance.  HENT:     Head: Normocephalic and atraumatic.  Cardiovascular:     Rate and Rhythm: Normal rate and regular rhythm.  Pulmonary:     Effort: Pulmonary effort is normal.     Breath sounds: Normal breath sounds.  Skin:    Comments: Scattered urticarial lesions on b/l arms and abdomen.   Neurological:     General: No focal deficit present.     Mental Status: She is alert.  Psychiatric:        Mood and Affect: Mood normal.     ------------------------------------------------------------------------------------------------------------------------------------------------------------------------------------------------------------------- Assessment and Plan  Wasp sting Recommend staying well hydrated.  Can apply ice to areas.  Adding hydroxyzine  as needed for itching.  Red flags reviewed.    Meds ordered this encounter  Medications   hydrOXYzine  (VISTARIL ) 25 MG capsule    Sig: Take 1 capsule (25 mg total) by mouth every 8 (eight) hours as needed.    Dispense:  30 capsule    Refill:  0    No follow-ups on file.

## 2024-05-16 NOTE — Patient Instructions (Signed)
Bee, Wasp, or [employer], Adult Bees, wasps, and hornets are part of a family of insects that sting. Normally, a sting will cause pain, redness, and swelling at the sting site. However, some people have an allergy to these stings, and their reactions can be much more serious. What increases the risk? You may be at a greater risk of getting stung if you: Provoke a stinging insect by swatting or disturbing it. Wear strong-smelling soaps, deodorants, or body sprays. Spend time outdoors near gardens with flowers or fruit trees or in clothes that expose skin. Eat or drink outside. What are the signs or symptoms? The reaction to an insect sting can vary from a mild, normal response to life-threatening anaphylaxis. The sting site is often a red lump in the skin, sometimes with a tiny hole in the center, that may still have the stinger in the center of the wound. Normal reaction A normal reaction is experienced by most people after an insect sting. Symptoms include: Pain, redness, and swelling at the sting site. These can develop over 24-48 hours. Pain, redness, and swelling that may spread to a larger, connected area beyond the sting site. The spreading can continue over 24-48 hours. Allergic reaction An allergic reaction can vary in severity and includes symptoms in other areas of the body beyond the sting site. People who experience an allergic reaction have a higher risk of having similar or worse symptoms the next time they are stung. Symptoms may include: Hives, itching, and swelling. Abdominal symptoms including cramping, nausea, vomiting, and diarrhea. Severe symptoms that require immediate medical attention include: Chest pain or tightness. Wheezing or trouble breathing. Swelling of the tongue, throat, or lips. Trouble swallowing or hoarse voice. Anaphylactic reaction An anaphylactic reaction is a severe, life-threatening allergy and requires immediate medical attention. The symptoms often  include severe allergic reaction symptoms that develop rapidly and lead to: A sudden and sharp drop in blood pressure. Dizziness. Loss of consciousness. How is this diagnosed? This condition is usually diagnosed based on your symptoms and medical history as well as a physical exam. You may have an allergy test to determine if you are allergic to the insect venom. How is this treated? If you were stung by a bee, the stinger and a small sac of venom may be in the wound. Remove the stinger as soon as possible. Do this by brushing across the wound with gauze, a clean fingernail, or a flat card such as a credit card. This can help reduce the severity of your body's reaction to the sting. Normal reactions can be treated with: Washing the area thoroughly with soap and water. Applying ice to the area to reduce swelling. Oral or topical medicines to help reduce pain and itching, if present. Pay close attention to your symptoms after you have been stung. If possible, have someone stay with you to see if an allergic reaction develops. If allergic symptoms develop, oral antihistamines can be taken and you will need medical help right away. If you had an allergic reaction before, you may need to: Use an auto-injector "pen"(pre-filled automatic epinephrine injection device)at the first sign of an allergic reaction. Get medical help right away because the epinephrine is short-acting. It is intended to give you more time to get to an emergency room. Follow these instructions at home:  Wash the sting site 2-3 times a day with soap and water as told by your health care provider. Apply or take over-the-counter and prescription medicines only as told  by your health care provider. If directed, apply ice to the sting area. Put ice in a plastic bag. Place a towel between your skin and the bag. Leave the ice on for 20 minutes, 2-3 times a day. Do not scratch the sting area. If you had a severe allergic reaction to  a sting, you may need to: Wear a medical bracelet or necklace that lists the allergy. Carry an anaphylaxis kit or an epinephrine auto-injector "pen" with you at all times. Tell your family members, friends, and coworkers when and how to use it. Use it at the first sign of an allergic reaction. How is this prevented? Avoid swatting at stinging insects and disturbing insect nests. Do not use fragrant soaps or lotions and avoid sitting near flowering plants, if possible. Wear shoes, pants, and long sleeves when spending time outdoors, especially in grassy areas where stinging insects are common. Keep outdoor areas free from nests or hives. Keep food and drink containers covered when eating outdoors. Wear gloves if you are gardening or working outdoors. Find a barrier between you and the insect(s), such as a door, if an attack by a stinging insect or a swarm seems likely. Contact a health care provider if: Your symptoms do not get better in 2-3 days. You have redness, swelling, or pain that spreads beyond the area of the sting. You have a fever. Get help right away if: You have symptoms of a severe allergic reaction. These include: Chest tightness or pain. Wheezing, or trouble swallowing or breathing. Light-headedness, dizziness, or fainting. Itchy, raised, red patches on the skin beyond the sting site. Abdominal cramping, nausea, vomiting, or diarrhea. Trouble swallowing or a swollen tongue, throat, or lips. These symptoms may be an emergency. Get help right away. Call 911. Do not wait to see if the symptoms will go away. Do not drive yourself to the hospital. Summary Stings from bees, wasps, and hornets can cause pain and swelling, but they are usually not serious. However, some people may have an allergic reaction to a sting. This can cause the symptoms to be more severe. Pay close attention to your symptoms after you have been stung. If possible, have someone stay with you to look for  signs of worsening symptoms. Call your health care provider if you have any signs of an allergic reaction. This information is not intended to replace advice given to you by your health care provider. Make sure you discuss any questions you have with your health care provider. Document Revised: 12/09/2021 Document Reviewed: 12/09/2021 Elsevier Patient Education  2024 [employer].

## 2024-05-18 ENCOUNTER — Other Ambulatory Visit (HOSPITAL_COMMUNITY): Payer: Self-pay

## 2024-05-18 ENCOUNTER — Telehealth: Payer: Self-pay

## 2024-05-18 NOTE — Telephone Encounter (Signed)
 Pharmacy Patient Advocate Encounter   Received notification from Patient Pharmacy that prior authorization for Trulicity  0.75mg /0.96ml is required/requested.   Insurance verification completed.   The patient is insured through [facility] .   Per test claim: PA required; PA submitted to above mentioned insurance via CoverMyMeds Key/confirmation #/EOC [id number] Status is pending

## 2024-05-19 ENCOUNTER — Other Ambulatory Visit (HOSPITAL_COMMUNITY): Payer: Self-pay

## 2024-05-22 ENCOUNTER — Other Ambulatory Visit (HOSPITAL_COMMUNITY): Payer: Self-pay

## 2024-05-22 NOTE — Telephone Encounter (Signed)
 Pharmacy Patient Advocate Encounter  Received notification from [facility] that Prior Authorization for Trulicity  0.75mg /0.61ml has been APPROVED from 05/18/24 to 05/18/25   PA #/Case ID/Reference #: [id number]

## 2024-05-24 ENCOUNTER — Ambulatory Visit: Payer: Self-pay

## 2024-05-24 NOTE — Telephone Encounter (Signed)
 FYI Only or Action Required?: Action required by provider: request for appointment.  Patient was last seen in primary care on 05/16/2024 by Alvia Bring, DO.  Called Nurse Triage reporting Insect Bite.  Symptoms began a week ago.  Interventions attempted: Prescription medications: benadryl cream.  Symptoms are: gradually worsening.  Triage Disposition: See Physician Within 24 Hours  Patient/caregiver understands and will follow disposition?: YesCopied from CRM [id number]. Topic: Clinical - Red Word Triage >> May 24, 2024 10:36 AM Susanna ORN wrote: Red Word that prompted transfer to Nurse Triage: Patient states she got stung by a bee on her breast nipple. States this happened on Sunday and now has clear drainage coming out of it. States it's very sore. Wants appt to see PCP. Reason for Disposition  [1] Red or very tender (to touch) area AND [2] started over 24 hours after the sting  Answer Assessment - Initial Assessment Questions 1. TYPE: What type of sting was it? (e.g., bee, yellow jacket, unknown)      Bee sting 2. ONSET: When did it occur?      7/20 3. LOCATION: Where is the sting located?  How many stings?     Right nipple 4. SWELLING SIZE: How big is the swelling? (e.g., inches or cm)     Not drastically different from left nipple 5. REDNESS: Is the area red or pink? If Yes, ask: What size is area of redness? (e.g., inches or cm). When did the redness start?     Red/purple  6. PAIN: Is there any pain? If Yes, ask: How bad is it?  (Scale 0-10; or none, mild, moderate, severe)     moderate 7. ITCHING: Is there any itching? If Yes, ask: How bad is it?      denies 8. RESPIRATORY DISTRESS: Describe your breathing.     denies 9. PRIOR REACTIONS: Have you had any severe allergic reactions to stings in the past? If Yes, ask: What happened?     denies 10. OTHER SYMPTOMS: Do you have any other symptoms? (e.g., abdomen pain, face or tongue swelling,  new rash elsewhere, vomiting)       denies   Pt seen last week for 14 bee stings overall. Right  Nipple bit is red/[purple and warm to touch now has Clear liquid came out when squeezed. Pt stated it is very tender to touch.  Protocols used: Bee or Yellow Jacket Sting-A-AH

## 2024-05-24 NOTE — Telephone Encounter (Signed)
 Scheduled for tomorrow.

## 2024-05-25 ENCOUNTER — Encounter: Payer: Self-pay | Admitting: Medical-Surgical

## 2024-05-25 ENCOUNTER — Ambulatory Visit (INDEPENDENT_AMBULATORY_CARE_PROVIDER_SITE_OTHER): Payer: Self-pay | Admitting: Medical-Surgical

## 2024-05-25 VITALS — BP 149/80 | HR 84 | Resp 20 | Ht 67.0 in | Wt 207.1 lb

## 2024-05-25 DIAGNOSIS — E1340 Other specified diabetes mellitus with diabetic neuropathy, unspecified: Secondary | ICD-10-CM

## 2024-05-25 DIAGNOSIS — N611 Abscess of the breast and nipple: Secondary | ICD-10-CM

## 2024-05-25 DIAGNOSIS — T63461A Toxic effect of venom of wasps, accidental (unintentional), initial encounter: Secondary | ICD-10-CM

## 2024-05-25 DIAGNOSIS — T63461D Toxic effect of venom of wasps, accidental (unintentional), subsequent encounter: Secondary | ICD-10-CM

## 2024-05-25 DIAGNOSIS — B3731 Acute candidiasis of vulva and vagina: Secondary | ICD-10-CM | POA: Diagnosis not present

## 2024-05-25 DIAGNOSIS — E1165 Type 2 diabetes mellitus with hyperglycemia: Secondary | ICD-10-CM | POA: Diagnosis not present

## 2024-05-25 DIAGNOSIS — Z7984 Long term (current) use of oral hypoglycemic drugs: Secondary | ICD-10-CM

## 2024-05-25 LAB — POCT GLYCOSYLATED HEMOGLOBIN (HGB A1C)
HbA1c, POC (controlled diabetic range): 11.6 % — AB (ref 0.0–7.0)
Hemoglobin A1C: 11.6 % — AB (ref 4.0–5.6)

## 2024-05-25 MED ORDER — DOXYCYCLINE HYCLATE 100 MG PO TABS: 100.0000 mg | ORAL_TABLET | Freq: Two times a day (BID) | ORAL | 0 refills | Status: DC

## 2024-05-25 MED ORDER — FLUCONAZOLE 150 MG PO TABS: 150.0000 mg | ORAL_TABLET | ORAL | 0 refills | Status: DC

## 2024-05-25 MED ORDER — METFORMIN HCL ER 500 MG PO TB24: 1000.0000 mg | ORAL_TABLET | Freq: Every day | ORAL | 1 refills | Status: AC

## 2024-05-25 MED ORDER — GLIPIZIDE 10 MG PO TABS: 10.0000 mg | ORAL_TABLET | Freq: Two times a day (BID) | ORAL | 1 refills | Status: DC

## 2024-05-25 NOTE — Progress Notes (Unsigned)
 Established patient visit   History of Present Illness   Discussed the use of AI scribe software for clinical note transcription with the patient, who gave verbal consent to proceed.  History of Present Illness   The patient, with diabetes, presents with a persistent reaction from multiple insect stings.  She was stung fourteen times by a bee or wasp on the stomach, groin, nipple, top of the breasts, arm, and back of the arm. Swelling and discomfort are most pronounced at the right nipple, with ongoing tenderness and a history of clear discharge followed by pus and blood. Pressure at the site has improved slightly today. She has not experienced fever or chills, though she has not checked her temperature.  She has used a heating blanket on the affected area and applied hydrocortisone cream. Benadryl provided initial relief but caused drowsiness; she has taken three doses.   Was unable to get her long-acting insulin  from the pharmacy because of insurance coverage issues. Had to switch to Trulicity  instead. Just picked this up in the last several days and has restarted it at 0.75mg  weekly. Checking glucose with readings noted greater than 300. Complains of severe vaginal itching with thick clumpy white discharge. Monistat for three rounds ineffective.      Physical Exam   Physical Exam Vitals reviewed. Chaperone present: Declined chaperone.  Constitutional:      General: She is not in acute distress.    Appearance: Normal appearance. She is not ill-appearing.  HENT:     Head: Normocephalic and atraumatic.  Cardiovascular:     Rate and Rhythm: Normal rate and regular rhythm.     Pulses: Normal pulses.     Heart sounds: Normal heart sounds. No murmur heard.    No friction rub. No gallop.  Pulmonary:     Effort: Pulmonary effort is normal. No respiratory distress.     Breath sounds: Normal breath sounds. No wheezing.  Chest:  Breasts:    Right: Swelling, nipple discharge, skin  change and tenderness present.     Left: Normal.    Lymphadenopathy:     Upper Body:     Right upper body: No supraclavicular, axillary or pectoral adenopathy.     Left upper body: No supraclavicular, axillary or pectoral adenopathy.  Skin:    General: Skin is warm and dry.     Findings: Lesion present.  Neurological:     Mental Status: She is alert and oriented to person, place, and time.  Psychiatric:        Mood and Affect: Mood normal.        Behavior: Behavior normal.        Thought Content: Thought content normal.        Judgment: Judgment normal.    Assessment & Plan   Assessment and Plan    Breast abscess following insect sting Persistent swelling and discharge with pus and blood suggest infection. No fever or chills. Potential abscess formation near milk ducts. - Prescribed doxycycline  100 mg twice daily for 7 days. - Instructed to wash area with warm, soapy water daily or more if drainage occurs, rinse well, pat dry. - Advised to avoid rubbing the area. - Discussed potential for area to return to normal appearance after treatment.  Type 2 diabetes mellitus, poorly controlled Poorly controlled diabetes with A1c at 11.6% today. Blood glucose levels between 350-450 mg/dL without medication. Resumed Trulicity  after insurance issues resolved. - Continue metformin  1000 mg daily with breakfast. -  Continue glipizide  10 mg twice daily with meals. - Continue Trulicity  0.75 mg weekly. - Check A1c level. - Ensure refills for metformin  and glipizide  are sent.  Recurrent vulvovaginal candidiasis Recurrent yeast infections likely due to poorly controlled blood glucose levels. Persistent symptoms despite Monistat treatment. - Prescribed Diflucan  (fluconazole ) orally, one tablet weekly for four weeks.     Follow up   Return in about 3 months (around 08/25/2024) for DM follow up with PCP.  __________________________________ Zada FREDRIK Palin, DNP, APRN, FNP-BC Primary Care and  Sports Medicine [facility] [place]

## 2024-06-27 ENCOUNTER — Encounter: Payer: Self-pay | Admitting: Sports Medicine

## 2024-06-30 ENCOUNTER — Other Ambulatory Visit: Payer: Self-pay | Admitting: Physician Assistant

## 2024-06-30 DIAGNOSIS — E1165 Type 2 diabetes mellitus with hyperglycemia: Secondary | ICD-10-CM

## 2024-07-10 ENCOUNTER — Ambulatory Visit (INDEPENDENT_AMBULATORY_CARE_PROVIDER_SITE_OTHER): Admitting: Physician Assistant

## 2024-07-10 VITALS — BP 111/72 | HR 83

## 2024-07-10 DIAGNOSIS — Z7984 Long term (current) use of oral hypoglycemic drugs: Secondary | ICD-10-CM

## 2024-07-10 DIAGNOSIS — E1165 Type 2 diabetes mellitus with hyperglycemia: Secondary | ICD-10-CM

## 2024-07-10 DIAGNOSIS — F17210 Nicotine dependence, cigarettes, uncomplicated: Secondary | ICD-10-CM

## 2024-07-10 DIAGNOSIS — E6609 Other obesity due to excess calories: Secondary | ICD-10-CM

## 2024-07-10 DIAGNOSIS — Z6833 Body mass index (BMI) 33.0-33.9, adult: Secondary | ICD-10-CM

## 2024-07-10 DIAGNOSIS — I10 Essential (primary) hypertension: Secondary | ICD-10-CM | POA: Diagnosis not present

## 2024-07-10 DIAGNOSIS — E66811 Obesity, class 1: Secondary | ICD-10-CM | POA: Diagnosis not present

## 2024-07-10 DIAGNOSIS — I252 Old myocardial infarction: Secondary | ICD-10-CM

## 2024-07-10 DIAGNOSIS — Z7985 Long-term (current) use of injectable non-insulin antidiabetic drugs: Secondary | ICD-10-CM

## 2024-07-10 DIAGNOSIS — I2510 Atherosclerotic heart disease of native coronary artery without angina pectoris: Secondary | ICD-10-CM | POA: Diagnosis not present

## 2024-07-10 LAB — POCT GLYCOSYLATED HEMOGLOBIN (HGB A1C): Hemoglobin A1C: 8.9 % — AB (ref 4.0–5.6)

## 2024-07-10 MED ORDER — TOUJEO MAX SOLOSTAR 300 UNIT/ML ~~LOC~~ SOPN: PEN_INJECTOR | SUBCUTANEOUS | 0 refills | Status: DC

## 2024-07-10 MED ORDER — TRULICITY 1.5 MG/0.5ML ~~LOC~~ SOAJ: 1.5000 mg | SUBCUTANEOUS | 2 refills | Status: DC

## 2024-07-10 MED ORDER — GLIPIZIDE 10 MG PO TABS: 10.0000 mg | ORAL_TABLET | Freq: Two times a day (BID) | ORAL | 0 refills | Status: DC

## 2024-07-10 NOTE — Patient Instructions (Signed)
 Increase toujeo : start with 10 units at bedtime and increase by 2 units every 5 days until fasting glucose is 90-120.  Increase trulicity  to 1.5mg  weekly.

## 2024-07-11 NOTE — Progress Notes (Signed)
 Established Patient Office Visit  Subjective   Patient ID: Denise Tyler, female    DOB: September 12, 1966  Age: 58 y.o. MRN: [id number]  Chief Complaint  Patient presents with   Medical Management of Chronic Issues    Surgical clarence     HPI Pt is a 58 yo obese female with uncontrolled T2DM, HTN, HLD, hx of MI who presents to the clinic for medication follow up. She needs to get her A1C under 8 so she can get a dental procedure to fix her front teeth. She has struggled with insurance in the past but now has insurance.   She has been compliant with medications. She is taking trulicity , metformin , glipizde for DM. She is working now. She is not exercising but is trying to be more active. She denies any recent CP or palpitations. She is checking sugars at home but still ranging 170s fasting. She continues to smoke but has cut back a lot to 1/2 a pack a day.   ROS See HPi.    Objective:     BP 111/72   Pulse 83   SpO2 99%  BP Readings from Last 3 Encounters:  07/10/24 111/72  05/25/24 (!) 149/80  05/16/24 121/74   Wt Readings from Last 3 Encounters:  05/25/24 207 lb 1.3 oz (93.9 kg)  05/16/24 211 lb (95.7 kg)  01/21/24 213 lb (96.6 kg)      Physical Exam Constitutional:      Appearance: Normal appearance. [name].  HENT:     Head: Normocephalic.  Cardiovascular:     Rate and Rhythm: Normal rate and regular rhythm.     Heart sounds: Murmur heard.  Pulmonary:     Effort: Pulmonary effort is normal.     Breath sounds: Normal breath sounds.  Musculoskeletal:     Cervical back: Neck supple.  Neurological:     Mental Status: She is alert and oriented to person, place, and time.      Results for orders placed or performed in visit on 07/10/24  POCT HgB A1C  Result Value Ref Range   Hemoglobin A1C 8.9 (A) 4.0 - 5.6 %   HbA1c POC (<> result, manual entry)     HbA1c, POC (prediabetic range)     HbA1c, POC (controlled diabetic range)        Assessment & Plan:   SABRASABRAShauntae was seen today for medical management of chronic issues.  Diagnoses and all orders for this visit:  Uncontrolled type 2 diabetes mellitus with hyperglycemia (HCC) -     POCT HgB A1C -     glipiZIDE  (GLUCOTROL ) 10 MG tablet; Take 1 tablet (10 mg total) by mouth 2 (two) times daily before a meal. -     insulin  glargine, 2 Unit Dial , (TOUJEO  MAX SOLOSTAR) 300 UNIT/ML Solostar Pen; Start 10 units every night and increase by 2 units every 5 days until fasting glucose is 90-120. Max dose 40 units. -     Dulaglutide  (TRULICITY ) 1.5 MG/0.5ML SOAJ; Inject 1.5 mg into the skin once a week.  Class 1 obesity due to excess calories with serious comorbidity and body mass index (BMI) of 33.0 to 33.9 in adult  CAD, multiple vessel  Cigarette nicotine  dependence without complication  Primary hypertension  History of MI (myocardial infarction)  Other orders -     Discontinue: Dulaglutide  (TRULICITY ) 1.5 MG/0.5ML SOAJ; Inject 1.5 mg into the skin once a week.   Pt needs to have some dental work done but will  not do the dental work until A1C under 8.  Pt is 8.6 today  Increase trulicity  to 1.5mg  weekly Added 10 units of toujeo  at bedtime and increase by 2 units every 5 days until fasting sugars 90-120 Continue on metformin  and glipizide  Discussed DM diet BP to goal on 2nd recheck On statin Needs eye exam Declined flu and pneumonia vaccine today  Continue to cut back on smoking      Return in about 3 months (around 10/09/2024).    Bryan Omura, PA-C

## 2024-07-17 ENCOUNTER — Encounter: Payer: Self-pay | Admitting: Physician Assistant

## 2024-08-21 ENCOUNTER — Other Ambulatory Visit: Payer: Self-pay | Admitting: Physician Assistant

## 2024-08-21 DIAGNOSIS — Z1231 Encounter for screening mammogram for malignant neoplasm of breast: Secondary | ICD-10-CM

## 2024-08-25 ENCOUNTER — Ambulatory Visit: Admitting: Physician Assistant

## 2024-09-08 ENCOUNTER — Ambulatory Visit: Payer: Self-pay | Admitting: Physician Assistant

## 2024-09-08 ENCOUNTER — Ambulatory Visit (INDEPENDENT_AMBULATORY_CARE_PROVIDER_SITE_OTHER): Admitting: Physician Assistant

## 2024-09-08 ENCOUNTER — Ambulatory Visit

## 2024-09-08 ENCOUNTER — Encounter: Payer: Self-pay | Admitting: Physician Assistant

## 2024-09-08 VITALS — BP 154/94 | HR 115 | Ht 67.0 in | Wt 216.0 lb

## 2024-09-08 DIAGNOSIS — I10 Essential (primary) hypertension: Secondary | ICD-10-CM

## 2024-09-08 DIAGNOSIS — I2510 Atherosclerotic heart disease of native coronary artery without angina pectoris: Secondary | ICD-10-CM

## 2024-09-08 DIAGNOSIS — Z7984 Long term (current) use of oral hypoglycemic drugs: Secondary | ICD-10-CM

## 2024-09-08 DIAGNOSIS — I252 Old myocardial infarction: Secondary | ICD-10-CM

## 2024-09-08 DIAGNOSIS — I700 Atherosclerosis of aorta: Secondary | ICD-10-CM | POA: Diagnosis not present

## 2024-09-08 DIAGNOSIS — Z7985 Long-term (current) use of injectable non-insulin antidiabetic drugs: Secondary | ICD-10-CM

## 2024-09-08 DIAGNOSIS — E1165 Type 2 diabetes mellitus with hyperglycemia: Secondary | ICD-10-CM | POA: Diagnosis not present

## 2024-09-08 DIAGNOSIS — R079 Chest pain, unspecified: Secondary | ICD-10-CM

## 2024-09-08 DIAGNOSIS — R0602 Shortness of breath: Secondary | ICD-10-CM | POA: Diagnosis not present

## 2024-09-08 DIAGNOSIS — M130 Polyarthritis, unspecified: Secondary | ICD-10-CM

## 2024-09-08 DIAGNOSIS — B353 Tinea pedis: Secondary | ICD-10-CM

## 2024-09-08 MED ORDER — TRULICITY 1.5 MG/0.5ML ~~LOC~~ SOAJ: 1.5000 mg | SUBCUTANEOUS | 2 refills | Status: DC

## 2024-09-08 MED ORDER — CELECOXIB 200 MG PO CAPS: ORAL_CAPSULE | ORAL | 1 refills | Status: AC

## 2024-09-08 MED ORDER — NITROGLYCERIN 0.4 MG SL SUBL: 0.4000 mg | SUBLINGUAL_TABLET | SUBLINGUAL | 0 refills | Status: AC | PRN

## 2024-09-08 MED ORDER — TERBINAFINE HCL 250 MG PO TABS: ORAL_TABLET | ORAL | 0 refills | Status: AC

## 2024-09-08 MED ORDER — KETOCONAZOLE 2 % EX CREA: 1.0000 | TOPICAL_CREAM | Freq: Two times a day (BID) | CUTANEOUS | 1 refills | Status: AC

## 2024-09-08 NOTE — Progress Notes (Signed)
 Acute Office Visit  Subjective:     Patient ID: Denise Tyler, female    DOB: 1965/12/29, 58 y.o.   MRN: [id number]   HPI .SABRADiscussed the use of AI scribe software for clinical note transcription with the patient, who gave verbal consent to proceed.  History of Present Illness Denise Tyler is a 58 year old female with uncontrolled T2DM, history of MI, HTN, HLD, GAD  who presents to the clinic for foot pain and rashes. She also mentions CP and SOB for 3 days.   Foot pain and cutaneous lesions - Scabs and sores present on both feet, accompanied by significant pain and tenderness - Pain prevents her from getting a pedicure and causes difficulty wearing shoes - Lesions are widespread with little brown spots noted - Previously used a prescribed cream for her condition but has run out - History of bone spurs  Left toe pain - Pain localized to the left toe, worsening and painful to touch - No recent trauma to the area - Pain exacerbated by bending the foot - Previously used Aleve  for pain management without relief - Trial of meloxicam  in the past without noticeable improvement  Bilateral shoulder pain - Persistent pain in both shoulders, right worse than left - Pain affects sleep, resulting in feeling unrested - Previously tried meloxicam  without relief - No trial of Celebrex  Hyperglycemia and chest heaviness - A1C not to go when last checked in September but too soon to check today - Heavy sensation in the chest, described as 'somebody's sitting there' - Has run out of nitroglycerin , which she previously used - Chest heaviness worsens with exertion and does not improve with rest - No fever, significant cough, or shortness of breath - Difficulty taking deep breaths    ROS See HPI.      Objective:    BP (!) 154/94   Pulse (!) 115   Ht 5' 7 (1.702 m)   Wt 216 lb (98 kg)   SpO2 95%   BMI 33.83 kg/m  BP Readings from Last 3 Encounters:  09/08/24 (!) 154/94  07/10/24  111/72  05/25/24 (!) 149/80   Wt Readings from Last 3 Encounters:  09/08/24 216 lb (98 kg)  05/25/24 207 lb 1.3 oz (93.9 kg)  05/16/24 211 lb (95.7 kg)      Physical Exam Constitutional:      Appearance: Normal appearance. [name].  HENT:     Head: Normocephalic.  Cardiovascular:     Rate and Rhythm: Normal rate.     Heart sounds: Murmur heard.  Pulmonary:     Effort: Pulmonary effort is normal.     Breath sounds: Normal breath sounds.     Comments: Tenderness to palpation over left and right chest wall but more tender over central chest.  Chest:     Chest wall: Tenderness present.  Abdominal:     General: Bowel sounds are normal. There is no distension.     Palpations: Abdomen is soft. There is no mass.     Tenderness: There is no abdominal tenderness. There is no right CVA tenderness, left CVA tenderness, guarding or rebound.  Musculoskeletal:     Right lower leg: No edema.     Left lower leg: No edema.  Skin:    Comments: Bilateral moccasin distrubution of hyperkeratotic scaly erythematous flaky rash.   Neurological:     General: No focal deficit present.     Mental Status: She is alert and oriented to person,  place, and time.     Motor: No weakness.     Gait: Gait normal.  Psychiatric:        Mood and Affect: Mood normal.          Assessment & Plan:  SABRASABRAJonny was seen today for medical management of chronic issues.  Diagnoses and all orders for this visit:  Chest pain, unspecified type -     CMP14+EGFR -     Cancel: CBC w/Diff/Platelet -     DG Chest 2 View; Future -     Cancel: Troponin T -     CK total and CKMB (cardiac)not at [facility] -     Cancel: D-dimer, quantitative -     Troponin T -     D-dimer, quantitative -     Comprehensive Metabolic Panel (CMET) -     CKMB -     CBC w/Diff/Platelet -     EKG 12-Lead  Tinea pedis, unspecified laterality -     terbinafine  (LAMISIL ) 250 MG tablet; Take once daily for 2 weeks. -     ketoconazole  (NIZORAL) 2 % cream; Apply 1 Application topically 2 (two) times daily. To bilateral feet.  CAD, multiple vessel -     nitroGLYCERIN  (NITROSTAT ) 0.4 MG SL tablet; Place 1 tablet (0.4 mg total) under the tongue every 5 (five) minutes as needed for chest pain. -     CMP14+EGFR -     Cancel: CBC w/Diff/Platelet -     DG Chest 2 View; Future -     Cancel: Troponin T -     CK total and CKMB (cardiac)not at [facility] -     Cancel: D-dimer, quantitative -     Troponin T -     D-dimer, quantitative  Primary hypertension -     nitroGLYCERIN  (NITROSTAT ) 0.4 MG SL tablet; Place 1 tablet (0.4 mg total) under the tongue every 5 (five) minutes as needed for chest pain. -     CMP14+EGFR -     Cancel: CBC w/Diff/Platelet -     DG Chest 2 View; Future -     Cancel: Troponin T -     CK total and CKMB (cardiac)not at [facility] -     Cancel: D-dimer, quantitative -     Troponin T -     D-dimer, quantitative  History of MI (myocardial infarction) -     nitroGLYCERIN  (NITROSTAT ) 0.4 MG SL tablet; Place 1 tablet (0.4 mg total) under the tongue every 5 (five) minutes as needed for chest pain. -     CMP14+EGFR -     Cancel: CBC w/Diff/Platelet -     DG Chest 2 View; Future -     Cancel: Troponin T -     CK total and CKMB (cardiac)not at [facility] -     Cancel: D-dimer, quantitative -     Troponin T -     D-dimer, quantitative -     Comprehensive Metabolic Panel (CMET) -     CKMB -     CBC w/Diff/Platelet  Uncontrolled type 2 diabetes mellitus with hyperglycemia (HCC) -     Dulaglutide  (TRULICITY ) 1.5 MG/0.5ML SOAJ; Inject 1.5 mg into the skin once a week.  Polyarthritis -     celecoxib (CELEBREX) 200 MG capsule; One to 2 tablets by mouth daily as needed for pain.   Assessment & Plan Tinea pedis with hyperkeratosis and secondary skin changes Chronic tinea pedis with hyperkeratosis and secondary skin changes. Previous topical  antifungal treatment was used up. No signs of infection currently. Fungal infections  are persistent and require dedicated treatment. - Prescribed a large tube of topical antifungal cream. - Prescribed oral lamisil  for 2 weeks.  - Advised soaking feet and using a pumice stone to remove dead tissue after cream application. - Discussed the importance of avoiding recontamination, including considering new shoes.  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 Previous treatment with meloxicam  was ineffective. Celebrex is considered as an alternative anti-inflammatory treatment. - Prescribed Celebrex as an alternative anti-inflammatory treatment. - left great toe, xray declined, likely OA. Hopefully celebrex will improve pain.   Atherosclerotic heart disease with chest pain and risk assessment Intermittent chest pain with heaviness, not consistent with cardiac origin. Differential includes costochondritis, pneumonia, PE, MI.  Pulse ox 95 percent, known COPD EKG: NSR at 82, prolonged QT, no ST elevation or depression, left atrial enlargement. No acute changes from prior EKG.  - Ordered cardiac enzyme labs to assess for cardiac strain. - Ordered chest x-ray to rule out pneumonia or other causes of chest pain. - Ordered d-dimer to evaluate concern for PE.  - Refilled nitroglycerin  prescription.  Type 2 diabetes mellitus with hyperglycemia  Due for A1c testing to assess long-term glucose control in December.   Primary hypertension Recent report of elevated blood pressure. - Rechecked blood pressure during the visit and did not improve very much.  - follow up in 1-2 weeks or sooner if symptoms worsening or not improving.     Jamiyla Ishee, PA-C

## 2024-09-08 NOTE — Progress Notes (Signed)
 e

## 2024-09-08 NOTE — Progress Notes (Signed)
 Denise Tyler,   No pneumonia or fluid on lungs. Still waiting on cardiac enzymes and d-dimer.

## 2024-09-08 NOTE — Patient Instructions (Addendum)
 Take once daily for 2 weeks, lamisil .  Use topical foot cream for 4 weeks.  Start celebrex for overall pain due to OA.  Get labs and CXR today to work up chest pain.    Athlete's Foot  Athlete's foot (tinea pedis) is a fungal infection of the skin on your feet. It often occurs on the skin that is between or underneath your toes. It can also occur on the soles of your feet. Symptoms include itchy or white and flaky areas on the skin. The infection can spread from person to person (is contagious). It can also spread when a person's bare feet come in contact with the fungus on shower floors or on items such as shoes. Follow these instructions at home: Medicines Apply or take over-the-counter and prescription medicines only as told by your doctor. Apply your antifungal medicine as told by your doctor. Do not stop using it even if your feet start to get better. Foot care Do not scratch your feet. Keep your feet dry: Wear cotton or wool socks. Change your socks every day or if they become wet. Wear shoes that allow air to move around, such as sandals or canvas tennis shoes. Wash and dry your feet: Every day or as told by your doctor. After exercising. Including the area between your toes. General instructions Do not share any of these items that touch your feet: Towels. Shoes. Nail clippers. Other personal items. Protect your feet by wearing sandals in wet areas, such as locker rooms and shared showers. Keep all follow-up visits. If you have diabetes, keep your blood sugar under control. Contact a doctor if: You have a fever. You have swelling, pain, warmth, or redness in your foot. Your feet are not getting better with treatment. Your symptoms get worse. You have new symptoms. You have very bad pain. Summary Athlete's foot is a fungal infection of the skin on your feet. This condition is caused by a fungus that grows in warm, moist places. Symptoms include itchy or white and flaky  areas on the skin. Apply your antifungal medicine as told by your doctor. Keep your feet clean and dry. This information is not intended to replace advice given to you by your health care provider. Make sure you discuss any questions you have with your health care provider. Document Revised: 02/02/2021 Document Reviewed: 02/02/2021 Elsevier Patient Education  2024 [employer].

## 2024-09-09 LAB — COMPREHENSIVE METABOLIC PANEL WITH GFR
ALT: 13 IU/L (ref 0–32)
AST: 9 IU/L (ref 0–40)
Albumin: 4.2 g/dL (ref 3.8–4.9)
Alkaline Phosphatase: 117 IU/L (ref 49–135)
BUN/Creatinine Ratio: 15 (ref 9–23)
BUN: 11 mg/dL (ref 6–24)
Bilirubin Total: 0.3 mg/dL (ref 0.0–1.2)
CO2: 23 mmol/L (ref 20–29)
Calcium: 9.1 mg/dL (ref 8.7–10.2)
Chloride: 100 mmol/L (ref 96–106)
Creatinine, Ser: 0.72 mg/dL (ref 0.57–1.00)
Globulin, Total: 2.2 g/dL (ref 1.5–4.5)
Glucose: 251 mg/dL — ABNORMAL HIGH (ref 70–99)
Potassium: 4.1 mmol/L (ref 3.5–5.2)
Sodium: 136 mmol/L (ref 134–144)
Total Protein: 6.4 g/dL (ref 6.0–8.5)
eGFR: 97 mL/min/1.73 (ref >59)

## 2024-09-09 LAB — CBC WITH DIFFERENTIAL/PLATELET
Basophils Absolute: 0.1 x10E3/uL (ref 0.0–0.2)
Basos: 1 %
EOS (ABSOLUTE): 0 x10E3/uL (ref 0.0–0.4)
Eos: 0 %
Hematocrit: 40.1 % (ref 34.0–46.6)
Hemoglobin: 13.6 g/dL (ref 11.1–15.9)
Immature Grans (Abs): 0 x10E3/uL (ref 0.0–0.1)
Immature Granulocytes: 0 %
Lymphocytes Absolute: 2.1 x10E3/uL (ref 0.7–3.1)
Lymphs: 28 %
MCH: 29 pg (ref 26.6–33.0)
MCHC: 33.9 g/dL (ref 31.5–35.7)
MCV: 86 fL (ref 79–97)
Monocytes Absolute: 0.4 x10E3/uL (ref 0.1–0.9)
Monocytes: 6 %
Neutrophils Absolute: 4.8 x10E3/uL (ref 1.4–7.0)
Neutrophils: 65 %
Platelets: 223 x10E3/uL (ref 150–450)
RBC: 4.69 x10E6/uL (ref 3.77–5.28)
RDW: 13 % (ref 11.7–15.4)
WBC: 7.4 x10E3/uL (ref 3.4–10.8)

## 2024-09-09 LAB — CREATININE KINASE MB: CK-MB Index: 1.6 ng/mL (ref 0.0–5.3)

## 2024-09-09 LAB — D-DIMER, QUANTITATIVE: D-DIMER: 0.42 mg{FEU}/L (ref 0.00–0.49)

## 2024-09-09 LAB — TROPONIN T: Troponin T (Highly Sensitive): 6 ng/L (ref 0–14)

## 2024-09-11 NOTE — Progress Notes (Signed)
 Denise Tyler,   D-dimer/Cardiac enzymes normal. Chest pain is likely pulled/strained muscle. Ice area consider icy hot patches.   Kidney and liver look good.   We need to continue to work on getting your sugars down.

## 2024-09-26 DIAGNOSIS — I255 Ischemic cardiomyopathy: Secondary | ICD-10-CM | POA: Diagnosis not present

## 2024-09-26 DIAGNOSIS — I2510 Atherosclerotic heart disease of native coronary artery without angina pectoris: Secondary | ICD-10-CM | POA: Diagnosis not present

## 2024-09-26 DIAGNOSIS — Z951 Presence of aortocoronary bypass graft: Secondary | ICD-10-CM | POA: Diagnosis not present

## 2024-09-26 DIAGNOSIS — E1169 Type 2 diabetes mellitus with other specified complication: Secondary | ICD-10-CM | POA: Diagnosis not present

## 2024-09-26 DIAGNOSIS — I252 Old myocardial infarction: Secondary | ICD-10-CM | POA: Diagnosis not present

## 2024-09-26 DIAGNOSIS — E785 Hyperlipidemia, unspecified: Secondary | ICD-10-CM | POA: Diagnosis not present

## 2024-10-11 ENCOUNTER — Ambulatory Visit: Admitting: Physician Assistant

## 2024-11-13 ENCOUNTER — Encounter: Payer: Self-pay | Admitting: Physician Assistant

## 2024-11-13 ENCOUNTER — Ambulatory Visit: Admitting: Physician Assistant

## 2024-11-13 VITALS — BP 126/66 | HR 74 | Wt 214.8 lb

## 2024-11-13 DIAGNOSIS — E1165 Type 2 diabetes mellitus with hyperglycemia: Secondary | ICD-10-CM

## 2024-11-13 DIAGNOSIS — I2510 Atherosclerotic heart disease of native coronary artery without angina pectoris: Secondary | ICD-10-CM

## 2024-11-13 DIAGNOSIS — G8929 Other chronic pain: Secondary | ICD-10-CM | POA: Insufficient documentation

## 2024-11-13 DIAGNOSIS — I10 Essential (primary) hypertension: Secondary | ICD-10-CM

## 2024-11-13 DIAGNOSIS — I25810 Atherosclerosis of coronary artery bypass graft(s) without angina pectoris: Secondary | ICD-10-CM

## 2024-11-13 DIAGNOSIS — R809 Proteinuria, unspecified: Secondary | ICD-10-CM

## 2024-11-13 LAB — POCT UA - MICROALBUMIN
Creatinine, POC: 50 mg/dL
Microalbumin Ur, POC: 10 mg/L

## 2024-11-13 LAB — POCT GLYCOSYLATED HEMOGLOBIN (HGB A1C): Hemoglobin A1C: 9.2 % — AB (ref 4.0–5.6)

## 2024-11-13 MED ORDER — LOSARTAN POTASSIUM 25 MG PO TABS: ORAL_TABLET | ORAL | 1 refills | Status: AC

## 2024-11-13 MED ORDER — GLIPIZIDE 10 MG PO TABS: 10.0000 mg | ORAL_TABLET | Freq: Two times a day (BID) | ORAL | 0 refills | Status: AC

## 2024-11-13 MED ORDER — AMLODIPINE BESYLATE 10 MG PO TABS: 10.0000 mg | ORAL_TABLET | Freq: Every day | ORAL | 1 refills | Status: AC

## 2024-11-13 MED ORDER — LANTUS SOLOSTAR 100 UNIT/ML ~~LOC~~ SOPN: PEN_INJECTOR | SUBCUTANEOUS | 1 refills | Status: AC

## 2024-11-13 MED ORDER — ATORVASTATIN CALCIUM 40 MG PO TABS: 40.0000 mg | ORAL_TABLET | Freq: Every day | ORAL | 1 refills | Status: AC

## 2024-11-13 MED ORDER — TIRZEPATIDE 2.5 MG/0.5ML ~~LOC~~ SOAJ: 2.5000 mg | SUBCUTANEOUS | 0 refills | Status: AC

## 2024-11-13 NOTE — Progress Notes (Unsigned)
 "  Established Patient Office Visit  Subjective   Patient ID: Denise Tyler, female    DOB: August 12, 1966  Age: 59 y.o. MRN: [id number]  Chief Complaint  Patient presents with   Medical Management of Chronic Issues    HPI .Discussed the use of AI scribe software for clinical note transcription with the patient, who gave verbal consent to proceed.  History of Present Illness Denise Tyler is a 59 year old female with diabetes and hypertension who presents with elevated blood sugar levels and medication management issues.  Hyperglycemia and diabetes management - Elevated blood glucose levels, with recent readings up to 400 mg/dL - Hemoglobin J8r is 0.7% - Currently using glipizide  and metformin  (Glucophage ) - Unable to afford insulin  due to price increase; not currently using insulin  - Monitors blood glucose with meter and strips purchased without prescription - Uncontrolled diabetes impacting ability to receive dental care  Hypertension - Recent blood pressure reading of 165/95 mmHg - Currently taking carvedilol twice daily, losartan  25 mg once daily, and amlodipine  10 mg once daily - Discontinued metoprolol   Lower extremity pain and sensory changes - History of leg pain; unable to secure surgical appointment until March 18th - Legs feel cold, numb, and sometimes hot - Burning sensation in legs at night  Right shoulder pain - Significant pain in right shoulder, intolerable at times - Pain affects ability to lay on right side  Dental issues - Unable to get tooth fixed due to high blood glucose levels - Dental problem affects speech and causes self-consciousness  Chest pain - Occasional chest pain, attributed to indigestion  Peripheral neuropathy - History of neuropathy in feet - Feet are painful and unresponsive to previous treatments - Feet feel terrible; has taken a whole bottle of medication for neuropathy without improvement  Medication use and pain management -  Currently taking atorvastatin  for cholesterol management - Uses Celebrex  for pain - Has medication for itching and anxiety, but has not used recently   IMPRESSION: 1.  Normal arterial waveforms and velocities within the right lower extremity. 2.  Focally increased velocities consistent with high-grade stenoses in the left iliac artery and distal left SFA. Decreased monophasic waveforms distally in the left lower extremity likely secondary to arterial calcifications and inflow disease.   ROS See HPI.    Objective:     BP 126/66   Pulse 74   Wt 214 lb 12 oz (97.4 kg)   SpO2 99%   BMI 33.63 kg/m  BP Readings from Last 3 Encounters:  11/13/24 126/66  09/08/24 (!) 154/94  07/10/24 111/72   Wt Readings from Last 3 Encounters:  11/13/24 214 lb 12 oz (97.4 kg)  09/08/24 216 lb (98 kg)  05/25/24 207 lb 1.3 oz (93.9 kg)      Physical Exam   Results for orders placed or performed in visit on 11/13/24  POCT HgB A1C  Result Value Ref Range   Hemoglobin A1C 9.2 (A) 4.0 - 5.6 %   HbA1c POC (<> result, manual entry)     HbA1c, POC (prediabetic range)     HbA1c, POC (controlled diabetic range)    POCT UA - Microalbumin  Result Value Ref Range   Microalbumin Ur, POC 10 mg/L   Creatinine, POC 50 mg/dL   Albumin/Creatinine Ratio, Urine, POC 30-300        Assessment & Plan:   .Gavina was seen today for medical management of chronic issues.  Diagnoses and all orders for this visit:  Uncontrolled type  2 diabetes mellitus with hyperglycemia (HCC) -     POCT HgB A1C -     POCT UA - Microalbumin -     losartan  (COZAAR ) 25 MG tablet; Take one tablet daily and can take another tablet if BP above 140/90. -     glipiZIDE  (GLUCOTROL ) 10 MG tablet; Take 1 tablet (10 mg total) by mouth 2 (two) times daily before a meal.  CAD, multiple vessel -     amLODipine  (NORVASC ) 10 MG tablet; Take 1 tablet (10 mg total) by mouth daily.  Primary hypertension -     amLODipine  (NORVASC ) 10 MG  tablet; Take 1 tablet (10 mg total) by mouth daily. -     losartan  (COZAAR ) 25 MG tablet; Take one tablet daily and can take another tablet if BP above 140/90.  Coronary artery disease involving coronary bypass graft of native heart without angina pectoris -     atorvastatin  (LIPITOR) 40 MG tablet; Take 1 tablet (40 mg total) by mouth daily.  Microalbuminuria -     losartan  (COZAAR ) 25 MG tablet; Take one tablet daily and can take another tablet if BP above 140/90.  Chronic right shoulder pain -     DG Shoulder Right; Future  Chronic right hip pain -     DG Hip Unilat W OR W/O Pelvis 2-3 Views Right; Future  Other orders -     tirzepatide  (MOUNJARO ) 2.5 MG/0.5ML Pen; Inject 2.5 mg into the skin once a week. -     insulin  glargine (LANTUS  SOLOSTAR) 100 UNIT/ML Solostar Pen; Take 10 units at bedtime and increase by 2 units every 5 days until fasting glucose 90-120. Max 40 units.    Assessment & Plan Uncontrolled type 2 diabetes mellitus with hyperglycemia and neuropathy Severe hyperglycemia with glucose levels at 400 mg/dL and current level is 0.7%. Neuropathy symptoms present. Current medications insufficient. Insurance issues limit access to insulin  and GLP-1 agonists. Immediate intervention required. - Initiated Mounjaro  2.5 mg weekly, increase to 5 mg after one month. - Initiated Lantus  10 units at bedtime, increase by 2 units every 5 days until morning blood sugars are 90-120 mg/dL. - Continue glipizide  and metformin . - Referred to pharmacist Channing for medication affordability assistance. - Provided samples of insulin  and GLP-1 agonists if available.  Primary hypertension Blood pressure was 165/95 mmHg, improved during visit. Carvedilol recently started, causing headache. Blood pressure management crucial to prevent cardiovascular complications. - Continue carvedilol twice daily. - Continue losartan  25 mg once daily. - Continue amlodipine  10 mg once daily. - Monitor blood  pressure regularly.  Atherosclerotic heart disease Intermittent chest pain, likely indigestion rather than cardiac. Atorvastatin  for cholesterol management. - Continue atorvastatin  for cholesterol management.  Peripheral artery disease of left lower extremity Narrowing and plaque in left iliac artery. Symptoms include coldness and numbness, likely exacerbated by diabetic neuropathy. Referral to vascular surgeon pending. - Continue follow up with vascular surgeon for evaluation and potential stenting.  Chronic right shoulder pain due to osteoarthritis Chronic pain with significant arthritis on x-rays. Pain intolerable, joint injections needed. Potential rotator cuff and labrum involvement. - Referred to sports medicine for ultrasound-guided joint injections. - Ordered x-ray of the right shoulder for further evaluation. - Provided exercises to help with shoulder impingement.     No follow-ups on file.    Shiya Fogelman, PA-C  "

## 2024-11-13 NOTE — Patient Instructions (Addendum)
 Start mounjaro  2.5mg  weekly.  Start lantus  at bedtime 10 units and increase by 2 units every 5 days until fasting glucose is 90-120.   Secondary Shoulder Impingement Syndrome Rehab Ask your health care provider which exercises are safe for you. Do exercises exactly as told by your provider and adjust them as told. It is normal to feel mild stretching, pulling, tightness, or discomfort as you do these exercises. Stop right away if you feel sudden pain or your pain gets worse. Do not begin these exercises until told by your provider. Stretching and range-of-motion exercise This exercise warms up your muscles and joints. It also improves the movement and flexibility of your neck and shoulder. This exercise also helps to relieve pain and stiffness. Cervical side bend  Using good posture, sit on a stable chair or stand up. Without moving your shoulders, slowly tilt your left / right ear toward your left / right shoulder until you feel a stretch in your neck (cervical) muscles on the other side. You should be looking straight ahead. Hold for __________ seconds. Slowly return to the starting position. Repeat the stretch on your left / right side. Repeat __________ times. Complete this exercise __________ times a day. Strengthening exercises These exercises build strength and endurance in your shoulder. Endurance is the ability to use your muscles for a long time, even after they get tired. Scapular protraction, supine, isotonic  Lie on your back on a firm surface (supine position). Hold a __________ weight in your left / right hand. Raise your left / right arm straight into the air so your hand is directly above your shoulder joint. Push the weight into the air so your shoulder (scapula) lifts off the surface that you are lying on. The scapula will push up or forward (protraction). Do not move your head, neck, or back. Hold for __________ seconds. Slowly return to the starting position. Let your  muscles relax completely before you repeat this exercise. Repeat __________ times. Complete this exercise __________ times a day. Scapular retraction, isotonic  Sit in a stable chair without armrests or stand up. Secure an exercise band to a stable object in front of you so the band is at shoulder height. Hold one end of the exercise band in each hand. Squeeze your shoulder blades (scapulae) together and move your elbows slightly behind you (retraction). Do not shrug your shoulders upward while you do this. Hold for __________ seconds. Slowly return to the starting position. Repeat __________ times. Complete this exercise __________ times a day. Shoulder extension with scapular retraction, isotonic  Sit in a stable chair without armrests or stand up. Secure an exercise band to a stable object in front of you so the band is above shoulder height. Hold one end of the exercise band in each hand. Straighten your elbows and lift your hands up to shoulder height. Squeeze your shoulder blades together (scapular retraction) and pull your hands down to the sides of your thighs (shoulder extension). Stop when your hands are straight down by your sides. Do not let your hands go behind your body. Hold for __________ seconds. Slowly return to the starting position. Repeat __________ times. Complete this exercise __________ times a day. Shoulder abduction, isotonic  Sit in a stable chair without armrests or stand up. If told, hold a __________ weight in your left / right hand. Start with your arms straight down. Turn your left / right hand so your palm faces in, toward your body. Slowly lift your left /  right hand out to your side (abduction). Do not lift your hand above shoulder height. Keep your arms straight. Avoid shrugging your shoulder while you do this movement. Keep your shoulder blade tucked down toward the middle of your back. Hold for __________ seconds. Slowly lower your arm, and return  to the starting position. Repeat __________ times. Complete this exercise __________ times a day. This information is not intended to replace advice given to you by your health care provider. Make sure you discuss any questions you have with your health care provider. Document Revised: 07/09/2022 Document Reviewed: 07/09/2022 Elsevier Patient Education  2024 [employer].

## 2024-11-17 ENCOUNTER — Ambulatory Visit

## 2024-11-17 DIAGNOSIS — M25511 Pain in right shoulder: Secondary | ICD-10-CM | POA: Diagnosis not present

## 2024-11-17 DIAGNOSIS — M25551 Pain in right hip: Secondary | ICD-10-CM | POA: Diagnosis not present

## 2024-11-17 DIAGNOSIS — G8929 Other chronic pain: Secondary | ICD-10-CM

## 2024-11-21 ENCOUNTER — Encounter: Payer: Self-pay | Admitting: Physician Assistant

## 2024-11-21 ENCOUNTER — Ambulatory Visit: Payer: Self-pay | Admitting: Physician Assistant

## 2024-11-21 DIAGNOSIS — M19011 Primary osteoarthritis, right shoulder: Secondary | ICD-10-CM | POA: Insufficient documentation

## 2024-11-21 DIAGNOSIS — M51369 Other intervertebral disc degeneration, lumbar region without mention of lumbar back pain or lower extremity pain: Secondary | ICD-10-CM | POA: Insufficient documentation

## 2024-11-21 DIAGNOSIS — M0088 Arthritis due to other bacteria, vertebrae: Secondary | ICD-10-CM | POA: Insufficient documentation

## 2024-11-21 DIAGNOSIS — G8929 Other chronic pain: Secondary | ICD-10-CM

## 2024-11-21 NOTE — Progress Notes (Signed)
 Mild shoulder arthritis. Referral to sports medicine to consider injections?

## 2024-11-21 NOTE — Progress Notes (Signed)
 Arthritis in SI joints, pubic symphysis, lower spine. The actual hip joint does not have that much arthritis. I think pain is more bursa pain.

## 2024-11-22 NOTE — Addendum Note (Signed)
 Addended by: Lira Stephen P on: 11/22/2024 09:12 AM   Modules accepted: Orders

## 2024-11-22 NOTE — Addendum Note (Signed)
 Addended by: ANTONIETTE VERMELL CROME on: 11/22/2024 04:09 PM   Modules accepted: Orders

## 2025-02-12 ENCOUNTER — Ambulatory Visit: Admitting: Physician Assistant
